# Patient Record
Sex: Female | Born: 1965
Health system: Southern US, Community
[De-identification: ages and names within clinical notes are randomized; demographics above are authoritative.]

## PROBLEM LIST (undated history)

## (undated) DIAGNOSIS — O24419 Gestational diabetes mellitus in pregnancy, unspecified control: Secondary | ICD-10-CM

## (undated) DIAGNOSIS — R5383 Other fatigue: Secondary | ICD-10-CM

## (undated) DIAGNOSIS — G5600 Carpal tunnel syndrome, unspecified upper limb: Secondary | ICD-10-CM

## (undated) DIAGNOSIS — E663 Overweight: Secondary | ICD-10-CM

## (undated) DIAGNOSIS — Z0000 Encounter for general adult medical examination without abnormal findings: Secondary | ICD-10-CM

## (undated) DIAGNOSIS — E785 Hyperlipidemia, unspecified: Secondary | ICD-10-CM

## (undated) DIAGNOSIS — R739 Hyperglycemia, unspecified: Secondary | ICD-10-CM

## (undated) DIAGNOSIS — R030 Elevated blood-pressure reading, without diagnosis of hypertension: Secondary | ICD-10-CM

## (undated) DIAGNOSIS — K219 Gastro-esophageal reflux disease without esophagitis: Secondary | ICD-10-CM

## (undated) DIAGNOSIS — IMO0001 Reserved for inherently not codable concepts without codable children: Secondary | ICD-10-CM

## (undated) DIAGNOSIS — B019 Varicella without complication: Secondary | ICD-10-CM

## (undated) HISTORY — DX: Varicella without complication: B01.9

## (undated) HISTORY — DX: Encounter for general adult medical examination without abnormal findings: Z00.00

## (undated) HISTORY — DX: Overweight: E66.3

## (undated) HISTORY — DX: Other fatigue: R53.83

## (undated) HISTORY — DX: Elevated blood-pressure reading, without diagnosis of hypertension: R03.0

## (undated) HISTORY — PX: APPENDECTOMY: SHX54

## (undated) HISTORY — DX: Hyperglycemia, unspecified: R73.9

## (undated) HISTORY — DX: Gastro-esophageal reflux disease without esophagitis: K21.9

## (undated) HISTORY — DX: Reserved for inherently not codable concepts without codable children: IMO0001

## (undated) HISTORY — DX: Hyperlipidemia, unspecified: E78.5

## (undated) HISTORY — DX: Carpal tunnel syndrome, unspecified upper limb: G56.00

## (undated) HISTORY — DX: Gestational diabetes mellitus in pregnancy, unspecified control: O24.419

---

## 1999-10-03 ENCOUNTER — Other Ambulatory Visit: Admission: RE | Admit: 1999-10-03 | Discharge: 1999-10-03 | Payer: Self-pay | Admitting: Obstetrics and Gynecology

## 2001-04-28 ENCOUNTER — Other Ambulatory Visit: Admission: RE | Admit: 2001-04-28 | Discharge: 2001-04-28 | Payer: Self-pay | Admitting: Obstetrics and Gynecology

## 2002-08-19 HISTORY — PX: ECTOPIC PREGNANCY SURGERY: SHX613

## 2003-03-01 ENCOUNTER — Observation Stay (HOSPITAL_COMMUNITY): Admission: AD | Admit: 2003-03-01 | Discharge: 2003-03-02 | Payer: Self-pay | Admitting: Obstetrics and Gynecology

## 2003-03-02 ENCOUNTER — Inpatient Hospital Stay (HOSPITAL_COMMUNITY): Admission: AD | Admit: 2003-03-02 | Discharge: 2003-03-03 | Payer: Self-pay | Admitting: Obstetrics & Gynecology

## 2003-12-19 ENCOUNTER — Other Ambulatory Visit: Admission: RE | Admit: 2003-12-19 | Discharge: 2003-12-19 | Payer: Self-pay | Admitting: Obstetrics and Gynecology

## 2008-08-19 LAB — HM MAMMOGRAPHY

## 2008-08-19 LAB — HM PAP SMEAR

## 2011-09-25 ENCOUNTER — Encounter: Payer: Self-pay | Admitting: Family Medicine

## 2011-09-25 ENCOUNTER — Ambulatory Visit (INDEPENDENT_AMBULATORY_CARE_PROVIDER_SITE_OTHER): Payer: PRIVATE HEALTH INSURANCE | Admitting: Family Medicine

## 2011-09-25 DIAGNOSIS — R5383 Other fatigue: Secondary | ICD-10-CM

## 2011-09-25 DIAGNOSIS — Z0000 Encounter for general adult medical examination without abnormal findings: Secondary | ICD-10-CM

## 2011-09-25 DIAGNOSIS — R030 Elevated blood-pressure reading, without diagnosis of hypertension: Secondary | ICD-10-CM

## 2011-09-25 DIAGNOSIS — Z1211 Encounter for screening for malignant neoplasm of colon: Secondary | ICD-10-CM | POA: Insufficient documentation

## 2011-09-25 DIAGNOSIS — O99810 Abnormal glucose complicating pregnancy: Secondary | ICD-10-CM

## 2011-09-25 DIAGNOSIS — R7309 Other abnormal glucose: Secondary | ICD-10-CM

## 2011-09-25 DIAGNOSIS — E663 Overweight: Secondary | ICD-10-CM

## 2011-09-25 DIAGNOSIS — E119 Type 2 diabetes mellitus without complications: Secondary | ICD-10-CM | POA: Insufficient documentation

## 2011-09-25 DIAGNOSIS — IMO0001 Reserved for inherently not codable concepts without codable children: Secondary | ICD-10-CM

## 2011-09-25 DIAGNOSIS — R5381 Other malaise: Secondary | ICD-10-CM

## 2011-09-25 DIAGNOSIS — O24419 Gestational diabetes mellitus in pregnancy, unspecified control: Secondary | ICD-10-CM

## 2011-09-25 DIAGNOSIS — E785 Hyperlipidemia, unspecified: Secondary | ICD-10-CM

## 2011-09-25 DIAGNOSIS — R739 Hyperglycemia, unspecified: Secondary | ICD-10-CM

## 2011-09-25 DIAGNOSIS — Z800 Family history of malignant neoplasm of digestive organs: Secondary | ICD-10-CM

## 2011-09-25 HISTORY — DX: Other fatigue: R53.83

## 2011-09-25 HISTORY — DX: Encounter for general adult medical examination without abnormal findings: Z00.00

## 2011-09-25 LAB — LIPID PANEL
HDL: 48.8 mg/dL (ref >39.00)
Total CHOL/HDL Ratio: 6
Triglycerides: 90 mg/dL (ref 0.0–149.0)
VLDL: 18 mg/dL (ref 0.0–40.0)

## 2011-09-25 LAB — RENAL FUNCTION PANEL
BUN: 15 mg/dL (ref 6–23)
CO2: 26 mEq/L (ref 19–32)
Chloride: 104 mEq/L (ref 96–112)
GFR: 80.28 mL/min (ref >60.00)
Phosphorus: 2.9 mg/dL (ref 2.3–4.6)
Potassium: 4.4 mEq/L (ref 3.5–5.1)

## 2011-09-25 LAB — HEMOGLOBIN A1C: Hgb A1c MFr Bld: 7.6 % — ABNORMAL HIGH (ref 4.6–6.5)

## 2011-09-25 LAB — CBC
Hemoglobin: 15.1 g/dL — ABNORMAL HIGH (ref 12.0–15.0)
Platelets: 267 10*3/uL (ref 150.0–400.0)
RDW: 14.3 % (ref 11.5–14.6)
WBC: 9 10*3/uL (ref 4.5–10.5)

## 2011-09-25 LAB — HEPATIC FUNCTION PANEL
AST: 21 U/L (ref 0–37)
Albumin: 4.6 g/dL (ref 3.5–5.2)
Alkaline Phosphatase: 75 U/L (ref 39–117)
Bilirubin, Direct: 0 mg/dL (ref 0.0–0.3)

## 2011-09-25 NOTE — Assessment & Plan Note (Signed)
Patient reports a fasting blood sugar of 127 a couple of years ago, repeated fasting renal today results pending. She is encouraged to minimize simple carbs

## 2011-09-25 NOTE — Assessment & Plan Note (Signed)
Avoid trans fats, minimize saturated fats and simple carbs and start a MegaRed cap daily

## 2011-09-25 NOTE — Assessment & Plan Note (Signed)
Patient given paperwork on the DASH diet and encouraged to attempt moderate increase in exercise, decrease in po intake and sodium and we will reassess at next visit.

## 2011-09-25 NOTE — Assessment & Plan Note (Signed)
Consider DASH diet, small and frequent meals, lean proteins and complex carbs

## 2011-09-25 NOTE — Assessment & Plan Note (Signed)
Denies any trouble since her pregnancy but will need to monitor

## 2011-09-25 NOTE — Assessment & Plan Note (Signed)
Multifactorial but does often get less than 6 hours of sleep each night. Encouraged to increase her sleep add exercise and eat a heart healthy diet.

## 2011-09-25 NOTE — Assessment & Plan Note (Addendum)
Will proceed with referral for colonoscopy and screening MGM and then she agrees to return next month for pap smear.

## 2011-09-25 NOTE — Patient Instructions (Signed)

## 2011-09-25 NOTE — Progress Notes (Signed)
Patient ID: Jennifer Odom, female   DOB: 1966/03/31, 46 y.o.   MRN: [id number] Jennifer Odom [id number] 07-13-1966 09/25/2011      Progress Note New Patient  Subjective  Chief Complaint  Chief Complaint  Patient presents with  . Establish Care    new patient/ labs for DM    HPI  Patient is a 46 year old Caucasian female who is in today for a new patient appointment. She is a single parent and only child is recently left for college. She works as a IT consultant. She reports overall she's doing well but is struggling with some persistent fatigue. Only gets about 5 or 6 hours of sleep a night. Notes in the past she had gestational diabetes but had been doing well until she had a blood sugar checked about 2 years ago. She reports fasting blood sugar 127. She denies polyuria or polydipsia. She denies any recent febrile illness, chest pain, palpitations, shortness of breath, GI or GU complaints otherwise. She has a family history significant for a mother who died at age 82 of colon cancer and has not proceed with colonoscopy thus far. She has not had a Pap smear or mammogram in several years either.  Past Medical History  Diagnosis Date  . Chicken pox as a child  . Mumps as a child  . Hyperglycemia   . Hyperlipidemia   . Elevated BP   . Overweight   . Preventative health care 09/25/2011  . Fatigue 09/25/2011  . Diabetes mellitus 1994    gestational   . GDM (gestational diabetes mellitus)     Past Surgical History  Procedure Date  . Appendectomy     high school  . Ectopic pregnancy surgery 2004    left side?    Family History  Problem Relation Age of Onset  . Cancer Mother     colon  . Diabetes Father     type 2  . Heart disease Father     open heart surgery,triple bypass  . Leukemia Father   . Cancer Father     CLL  . Stroke Maternal Grandmother     X several  . Hypertension Maternal Grandmother   . Emphysema Maternal Grandfather     smoker  . Heart attack Paternal  Grandfather   . Heart disease Paternal Grandfather     MI, sudden death  . Endometriosis Sister     History   Social History  . Marital Status: Divorced    Spouse Name: N/A    Number of Children: N/A  . Years of Education: N/A   Occupational History  . Not on file.   Social History Main Topics  . Smoking status: Never Smoker   . Smokeless tobacco: Never Used  . Alcohol Use: Yes     maybe once a month  . Drug Use: No  . Sexually Active: No   Other Topics Concern  . Not on file   Social History Narrative  . No narrative on file    No current outpatient prescriptions on file prior to visit.    Allergies  Allergen Reactions  . Sulfa Antibiotics Hives    Review of Systems  Review of Systems  Constitutional: Positive for malaise/fatigue. Negative for fever and chills.  HENT: Negative for hearing loss, nosebleeds and congestion.   Eyes: Negative for discharge.  Respiratory: Negative for cough, sputum production, shortness of breath and wheezing.   Cardiovascular: Negative for chest pain, palpitations and leg swelling.  Gastrointestinal: Negative for heartburn, nausea,  vomiting, abdominal pain, diarrhea, constipation and blood in stool.  Genitourinary: Negative for dysuria, urgency, frequency and hematuria.  Musculoskeletal: Negative for myalgias, back pain and falls.  Skin: Negative for rash.  Neurological: Negative for dizziness, tremors, sensory change, focal weakness, loss of consciousness, weakness and headaches.  Endo/Heme/Allergies: Negative for polydipsia. Does not bruise/bleed easily.  Psychiatric/Behavioral: Negative for depression and suicidal ideas. The patient is not nervous/anxious and does not have insomnia.     Objective  BP 138/94  Pulse 89  Temp(Src) 98.9 F (37.2 C) (Temporal)  Ht 5' 5.75" (1.67 m)  Wt 190 lb 6.4 oz (86.365 kg)  BMI 30.97 kg/m2  SpO2 96%  LMP 09/11/2011  Physical Exam  Physical Exam  Constitutional: She is oriented to  person, place, and time and well-developed, well-nourished, and in no distress. No distress.  HENT:  Head: Normocephalic and atraumatic.  Right Ear: External ear normal.  Left Ear: External ear normal.  Nose: Nose normal.  Mouth/Throat: Oropharynx is clear and moist. No oropharyngeal exudate.  Eyes: Conjunctivae are normal. Pupils are equal, round, and reactive to light. Right eye exhibits no discharge. Left eye exhibits no discharge. No scleral icterus.  Neck: Normal range of motion. Neck supple. No thyromegaly present.  Cardiovascular: Normal rate, regular rhythm, normal heart sounds and intact distal pulses.   No murmur heard. Pulmonary/Chest: Effort normal and breath sounds normal. No respiratory distress. She has no wheezes. She has no rales.  Abdominal: Soft. Bowel sounds are normal. She exhibits no distension and no mass. There is no tenderness.  Musculoskeletal: Normal range of motion. She exhibits no edema and no tenderness.  Lymphadenopathy:    She has no cervical adenopathy.  Neurological: She is alert and oriented to person, place, and time. She has normal reflexes. No cranial nerve deficit. Coordination normal.  Skin: Skin is warm and dry. No rash noted. She is not diaphoretic.  Psychiatric: Mood, memory and affect normal.       Assessment & Plan  Preventative health care Will proceed with referral for colonoscopy and screening MGM and then she agrees to return next month for pap smear.  Elevated BP Patient given paperwork on the DASH diet and encouraged to attempt moderate increase in exercise, decrease in po intake and sodium and we will reassess at next visit.  Hyperglycemia Patient reports a fasting blood sugar of 127 a couple of years ago, repeated fasting renal today results pending. She is encouraged to minimize simple carbs  GDM (gestational diabetes mellitus) Denies any trouble since her pregnancy but will need to monitor  Fatigue Multifactorial but does  often get less than 6 hours of sleep each night. Encouraged to increase her sleep add exercise and eat a heart healthy diet.  Overweight Consider DASH diet, small and frequent meals, lean proteins and complex carbs  Hyperlipidemia Avoid trans fats, minimize saturated fats and simple carbs and start a MegaRed cap daily

## 2011-09-26 ENCOUNTER — Encounter: Payer: Self-pay | Admitting: Family Medicine

## 2011-09-26 NOTE — Telephone Encounter (Signed)
FYI

## 2011-10-01 ENCOUNTER — Ambulatory Visit (INDEPENDENT_AMBULATORY_CARE_PROVIDER_SITE_OTHER): Payer: PRIVATE HEALTH INSURANCE | Admitting: Family Medicine

## 2011-10-01 ENCOUNTER — Encounter: Payer: Self-pay | Admitting: Family Medicine

## 2011-10-01 VITALS — BP 136/100 | HR 94 | Temp 99.2°F | Ht 65.75 in | Wt 189.0 lb

## 2011-10-01 DIAGNOSIS — E785 Hyperlipidemia, unspecified: Secondary | ICD-10-CM

## 2011-10-01 DIAGNOSIS — R030 Elevated blood-pressure reading, without diagnosis of hypertension: Secondary | ICD-10-CM

## 2011-10-01 DIAGNOSIS — IMO0001 Reserved for inherently not codable concepts without codable children: Secondary | ICD-10-CM

## 2011-10-01 DIAGNOSIS — R7301 Impaired fasting glucose: Secondary | ICD-10-CM

## 2011-10-01 DIAGNOSIS — E119 Type 2 diabetes mellitus without complications: Secondary | ICD-10-CM

## 2011-10-01 LAB — GLUCOSE, POCT (MANUAL RESULT ENTRY): POC Glucose: 110

## 2011-10-01 MED ORDER — PSYLLIUM 58.6 % PO POWD: 1.0000 | Freq: Every day | ORAL | Status: AC

## 2011-10-01 MED ORDER — KRILL OIL PO CAPS: 1.0000 | ORAL_CAPSULE | Freq: Every day | ORAL | Status: AC

## 2011-10-01 MED ORDER — NIACIN ER 500 MG PO CPCR: ORAL_CAPSULE | ORAL | Status: DC

## 2011-10-01 NOTE — Assessment & Plan Note (Signed)
Strongly encouraged to consider starting a statin, would like to try diet and exercise for 3 months. She will start MegaRed, Metamucil and Niacin, avoid trans fats

## 2011-10-01 NOTE — Assessment & Plan Note (Signed)
Patient very anxious today, minimize sodium recheck at next visit.

## 2011-10-01 NOTE — Assessment & Plan Note (Signed)
Patient given glucometer and training today, encouraged to check blood sugars qam and as needed, keep a log. Patient has made a commitment to change her diet and has eaten no sweets and consumed minimal carbs since her she was given the news of her DM last. Week. She has increased her exercise, she is given diet and educational materials today. We will recheck hgba1c in May of 2013 and consider meds at that time if hgba1c remains above 7.

## 2011-10-01 NOTE — Patient Instructions (Signed)
Diabetes and Exercise Regular exercise is important and can help:   Control blood glucose (sugar).   Decrease blood pressure.    Control blood lipids (cholesterol, triglycerides).   Improve overall health.  BENEFITS FROM EXERCISE  Improved fitness.   Improved flexibility.   Improved endurance.   Increased bone density.   Weight control.   Increased muscle strength.   Decreased body fat.   Improvement of the body's use of insulin, a hormone.   Increased insulin sensitivity.   Reduction of insulin needs.   Reduced stress and tension.   Helps you feel better.  People with diabetes who add exercise to their lifestyle gain additional benefits, including:  Weight loss.   Reduced appetite.   Improvement of the body's use of blood glucose.   Decreased risk factors for heart disease:   Lowering of cholesterol and triglycerides.   Raising the level of good cholesterol (high-density lipoproteins, HDL).   Lowering blood sugar.   Decreased blood pressure.  TYPE 1 DIABETES AND EXERCISE  Exercise will usually lower your blood glucose.   If blood glucose is greater than 240 mg/dl, check urine ketones. If ketones are present, do not exercise.   Location of the insulin injection sites may need to be adjusted with exercise. Avoid injecting insulin into areas of the body that will be exercised. For example, avoid injecting insulin into:   The arms when playing tennis.   The legs when jogging. For more information, discuss this with your caregiver.   Keep a record of:   Food intake.   Type and amount of exercise.   Expected peak times of insulin action.   Blood glucose levels.  Do this before, during, and after exercise. Review your records with your caregiver. This will help you to develop guidelines for adjusting food intake and insulin amounts.  TYPE 2 DIABETES AND EXERCISE  Regular physical activity can help control blood glucose.   Exercise is important  because it may:   Increase the body's sensitivity to insulin.   Improve blood glucose control.   Exercise reduces the risk of heart disease. It decreases serum cholesterol and triglycerides. It also lowers blood pressure.   Those who take insulin or oral hypoglycemic agents should watch for signs of hypoglycemia. These signs include dizziness, shaking, sweating, chills, and confusion.   Body water is lost during exercise. It must be replaced. This will help to avoid loss of body fluids (dehydration) or heat stroke.  Be sure to talk to your caregiver before starting an exercise program to make sure it is safe for you. Remember, any activity is better than none.  Document Released: 10/26/2003 Document Revised: 04/17/2011 Document Reviewed: 02/09/2009 [facility] Patient Information 2012 [place], [place].  Fasting sugars between 80  And 120 After eating in 1-2 hours can tolerate sugars 100 to 150 MegaRed caps daily Metamucil daily Avoid trans fats/partially hydrogenated oils

## 2011-10-01 NOTE — Progress Notes (Signed)
Patient ID: Jennifer Odom, female   DOB: 1966-04-21, 46 y.o.   MRN: [id number] Jennifer Odom [id number] 16-Jan-1966 10/01/2011      Progress Note-Follow Up  Subjective  Chief Complaint  Chief Complaint  Patient presents with  . Follow-up    HPI  Patient is a 46 year old Caucasian female who is in today to discuss her new onset diabetes. Her hemoglobin A1c was 7.6. Since we told her last week she cut out sugar, diet sodas and most herbs. She says she feels better and is tired she's been exercising. She has changed her diet and is committed to trying to bring her sugars and cholesterol down. Denies headaches, chest pain, palpitations or recent illness.  Past Medical History  Diagnosis Date  . Chicken pox as a child  . Mumps as a child  . Hyperglycemia   . Hyperlipidemia   . Elevated BP   . Overweight   . Preventative health care 09/25/2011  . Fatigue 09/25/2011  . Diabetes mellitus 1994    gestational   . GDM (gestational diabetes mellitus)   . Diabetes mellitus     Past Surgical History  Procedure Date  . Appendectomy     high school  . Ectopic pregnancy surgery 2004    left side?    Family History  Problem Relation Age of Onset  . Cancer Mother     colon  . Diabetes Father     type 2  . Heart disease Father     open heart surgery,triple bypass  . Leukemia Father   . Cancer Father     CLL  . Stroke Maternal Grandmother     X several  . Hypertension Maternal Grandmother   . Emphysema Maternal Grandfather     smoker  . Heart attack Paternal Grandfather   . Heart disease Paternal Grandfather     MI, sudden death  . Endometriosis Sister     History   Social History  . Marital Status: Divorced    Spouse Name: N/A    Number of Children: N/A  . Years of Education: N/A   Occupational History  . Not on file.   Social History Main Topics  . Smoking status: Never Smoker   . Smokeless tobacco: Never Used  . Alcohol Use: Yes     maybe once a month  .  Drug Use: No  . Sexually Active: No   Other Topics Concern  . Not on file   Social History Narrative  . No narrative on file    Current Outpatient Prescriptions on File Prior to Visit  Medication Sig Dispense Refill  . fish oil-omega-3 fatty acids 1000 MG capsule Take 2 g by mouth daily.      . Multiple Vitamin (MULTIVITAMIN) tablet Take 1 tablet by mouth daily.        Allergies  Allergen Reactions  . Sulfa Antibiotics Hives    Review of Systems  Review of Systems  Constitutional: Positive for malaise/fatigue. Negative for fever.  HENT: Negative for congestion.   Eyes: Negative for discharge.  Respiratory: Negative for shortness of breath.   Cardiovascular: Negative for chest pain, palpitations and leg swelling.  Gastrointestinal: Negative for nausea, abdominal pain and diarrhea.  Genitourinary: Negative for dysuria.  Musculoskeletal: Negative for falls.  Skin: Negative for rash.  Neurological: Negative for loss of consciousness and headaches.  Endo/Heme/Allergies: Negative for polydipsia.  Psychiatric/Behavioral: Negative for depression and suicidal ideas. The patient is not nervous/anxious and does not have insomnia.  Objective  BP 136/100  Pulse 94  Temp(Src) 99.2 F (37.3 C) (Temporal)  Ht 5' 5.75" (1.67 m)  Wt 189 lb (85.73 kg)  BMI 30.74 kg/m2  SpO2 97%  LMP 09/11/2011  Physical Exam  Physical Exam  Constitutional: She is oriented to person, place, and time and well-developed, well-nourished, and in no distress. No distress.  HENT:  Head: Normocephalic and atraumatic.  Eyes: Conjunctivae are normal.  Neck: Neck supple. No thyromegaly present.  Cardiovascular: Normal rate, regular rhythm and normal heart sounds.   No murmur heard. Pulmonary/Chest: Effort normal and breath sounds normal. She has no wheezes.  Abdominal: She exhibits no distension and no mass.  Musculoskeletal: She exhibits no edema.  Lymphadenopathy:    She has no cervical  adenopathy.  Neurological: She is alert and oriented to person, place, and time.  Skin: Skin is warm and dry. No rash noted. She is not diaphoretic.  Psychiatric: Memory, affect and judgment normal.    Lab Results  Component Value Date   TSH 2.00 09/25/2011   Lab Results  Component Value Date   WBC 9.0 09/25/2011   HGB 15.1* 09/25/2011   HCT 45.0 09/25/2011   MCV 87.8 09/25/2011   PLT 267.0 09/25/2011   Lab Results  Component Value Date   CREATININE 0.8 09/25/2011   BUN 15 09/25/2011   NA 138 09/25/2011   K 4.4 09/25/2011   CL 104 09/25/2011   CO2 26 09/25/2011   Lab Results  Component Value Date   ALT 27 09/25/2011   AST 21 09/25/2011   ALKPHOS 75 09/25/2011   BILITOT 0.5 09/25/2011   Lab Results  Component Value Date   CHOL 269* 09/25/2011   Lab Results  Component Value Date   HDL 48.80 09/25/2011   No results found for this basename: LDLCALC   Lab Results  Component Value Date   TRIG 90.0 09/25/2011   Lab Results  Component Value Date   CHOLHDL 6 09/25/2011     Assessment & Plan  Diabetes mellitus Patient given glucometer and training today, encouraged to check blood sugars qam and as needed, keep a log. Patient has made a commitment to change her diet and has eaten no sweets and consumed minimal carbs since her she was given the news of her DM last. Week. She has increased her exercise, she is given diet and educational materials today. We will recheck hgba1c in May of 2013 and consider meds at that time if hgba1c remains above 7.   Elevated BP Patient very anxious today, minimize sodium recheck at next visit.  Hyperlipidemia Strongly encouraged to consider starting a statin, would like to try diet and exercise for 3 months. She will start MegaRed, Metamucil and Niacin, avoid trans fats

## 2011-10-02 ENCOUNTER — Encounter: Payer: Self-pay | Admitting: Gastroenterology

## 2011-10-02 MED ORDER — GLUCOSE BLOOD VI STRP: 1.0000 | ORAL_STRIP | Status: DC | PRN

## 2011-10-02 NOTE — Progress Notes (Signed)
Addended by: Court Joy on: 10/02/2011 01:59 PM   Modules accepted: Orders

## 2011-10-23 ENCOUNTER — Ambulatory Visit: Payer: PRIVATE HEALTH INSURANCE | Admitting: Family Medicine

## 2011-10-23 DIAGNOSIS — Z0289 Encounter for other administrative examinations: Secondary | ICD-10-CM

## 2011-10-30 ENCOUNTER — Other Ambulatory Visit: Payer: PRIVATE HEALTH INSURANCE | Admitting: Gastroenterology

## 2012-01-22 ENCOUNTER — Encounter: Payer: Self-pay | Admitting: Family Medicine

## 2012-01-23 ENCOUNTER — Encounter: Payer: Self-pay | Admitting: Family Medicine

## 2012-01-23 ENCOUNTER — Ambulatory Visit (INDEPENDENT_AMBULATORY_CARE_PROVIDER_SITE_OTHER): Payer: PRIVATE HEALTH INSURANCE | Admitting: Family Medicine

## 2012-01-23 VITALS — BP 130/85 | HR 77 | Temp 98.1°F | Ht 65.75 in | Wt 185.0 lb

## 2012-01-23 DIAGNOSIS — E119 Type 2 diabetes mellitus without complications: Secondary | ICD-10-CM

## 2012-01-23 DIAGNOSIS — E785 Hyperlipidemia, unspecified: Secondary | ICD-10-CM

## 2012-01-23 DIAGNOSIS — E663 Overweight: Secondary | ICD-10-CM

## 2012-01-23 DIAGNOSIS — R5383 Other fatigue: Secondary | ICD-10-CM

## 2012-01-23 DIAGNOSIS — R030 Elevated blood-pressure reading, without diagnosis of hypertension: Secondary | ICD-10-CM

## 2012-01-23 DIAGNOSIS — IMO0001 Reserved for inherently not codable concepts without codable children: Secondary | ICD-10-CM

## 2012-01-23 DIAGNOSIS — G5600 Carpal tunnel syndrome, unspecified upper limb: Secondary | ICD-10-CM

## 2012-01-23 DIAGNOSIS — R5381 Other malaise: Secondary | ICD-10-CM

## 2012-01-23 HISTORY — DX: Carpal tunnel syndrome, unspecified upper limb: G56.00

## 2012-01-23 LAB — HEPATIC FUNCTION PANEL
ALT: 23 U/L (ref 0–35)
AST: 17 U/L (ref 0–37)
Bilirubin, Direct: 0 mg/dL (ref 0.0–0.3)
Total Protein: 7.3 g/dL (ref 6.0–8.3)

## 2012-01-23 LAB — RENAL FUNCTION PANEL
BUN: 14 mg/dL (ref 6–23)
CO2: 27 mEq/L (ref 19–32)
Calcium: 9.2 mg/dL (ref 8.4–10.5)
Chloride: 107 mEq/L (ref 96–112)
GFR: 109.13 mL/min (ref >60.00)

## 2012-01-23 LAB — CBC
MCHC: 32.7 g/dL (ref 30.0–36.0)
MCV: 87.1 fl (ref 78.0–100.0)
RBC: 5.02 Mil/uL (ref 3.87–5.11)
RDW: 13.8 % (ref 11.5–14.6)

## 2012-01-23 LAB — LIPID PANEL: HDL: 47.2 mg/dL (ref >39.00)

## 2012-01-23 LAB — HEMOGLOBIN A1C: Hgb A1c MFr Bld: 7.3 % — ABNORMAL HIGH (ref 4.6–6.5)

## 2012-01-23 NOTE — Patient Instructions (Signed)

## 2012-01-23 NOTE — Assessment & Plan Note (Signed)
Good weight loss since last visit with dietary changes, has yet to incorporate exercise so she is encouraged to try to add even a 15 minute walk daily and continue avoid simple carbs and trans fats.

## 2012-01-23 NOTE — Assessment & Plan Note (Signed)
Patient will have repeat lipid panel run today. She will continue MegaRed and Niacin. She has some Red Yeast Rice and has decided to start this bid. She is encouraged to start some CoQ10 as well and we will continue to monitor

## 2012-01-23 NOTE — Assessment & Plan Note (Signed)
Improving with dietary changes

## 2012-01-23 NOTE — Assessment & Plan Note (Signed)
Has made good dietary changes and had weight loss has cut out most carbs. Did have one low bs of 48 but generally hr numbers are in the normal range. Check a hgba1c today

## 2012-01-23 NOTE — Assessment & Plan Note (Signed)
Vs thoracic outlet syndrome, does have symptoms c/w both. Encouraged moist heat and gentle stretching at neck and ice and Aspercreme at wrists. Consider Physiatry if no improvement. Does work long hours at [employer] and on a computer

## 2012-01-23 NOTE — Progress Notes (Signed)
Patient ID: Jennifer Odom, female   DOB: [birth year], 46 y.o.   MRN: [id number] Jennifer Odom [id number] [birth year] 01/23/2012      Progress Note-Follow Up  Subjective  Chief Complaint  Chief Complaint  Patient presents with  . Follow-up    A1C checked also    HPI  Patient is a 46 year old Caucasian female who is in today for followup of on diabetes and multiple medical problems. Overall she's made many good changes. She's cut out simple carbs. She is avoiding transplant. She's added Krill oil and niacin. She reports she feels better she's lost weight. Her energy level somewhat better. In general her blood sugars are within normal ranges although she did have one number at 40 and she felt anxious and shaky. Her only new complaint today is of paresthesias in her fingers. Denies any in her feet. Happens in both hands and encompasses the whole hand. He does. Yesterday was present for most of the day but more often occurs in the evenings. Mornings are not bad and she doesn't wake up with the sensation. She has some stiffness in her neck and shoulders as well. Denies any falls or weakness.  Past Medical History  Diagnosis Date  . Chicken pox as a child  . Mumps as a child  . Hyperglycemia   . Hyperlipidemia   . Elevated BP   . Overweight   . Preventative health care 09/25/2011  . Fatigue 09/25/2011  . Diabetes mellitus 1994    gestational   . GDM (gestational diabetes mellitus)   . Diabetes mellitus     Past Surgical History  Procedure Date  . Appendectomy     high school  . Ectopic pregnancy surgery 2004    left side?    Family History  Problem Relation Age of Onset  . Cancer Mother     colon  . Diabetes Father     type 2  . Heart disease Father     open heart surgery,triple bypass  . Leukemia Father   . Cancer Father     CLL  . Stroke Maternal Grandmother     X several  . Hypertension Maternal Grandmother   . Emphysema Maternal Grandfather     smoker  . Heart  attack Paternal Grandfather   . Heart disease Paternal Grandfather     MI, sudden death  . Endometriosis Sister     History   Social History  . Marital Status: Divorced    Spouse Name: N/A    Number of Children: N/A  . Years of Education: N/A   Occupational History  . Not on file.   Social History Main Topics  . Smoking status: Never Smoker   . Smokeless tobacco: Never Used  . Alcohol Use: Yes     maybe once a month  . Drug Use: No  . Sexually Active: No   Other Topics Concern  . Not on file   Social History Narrative  . No narrative on file    Current Outpatient Prescriptions on File Prior to Visit  Medication Sig Dispense Refill  . fish oil-omega-3 fatty acids 1000 MG capsule Take 2 g by mouth daily.      Marland Kitchen glucose blood (FREESTYLE LITE) test strip 1 each by Other route as needed for other.  100 each  4  . Krill Oil CAPS Take 1 capsule by mouth daily.  30 capsule  0  . Multiple Vitamin (MULTIVITAMIN) tablet Take 1 tablet by mouth daily.      Marland Kitchen  niacin 500 MG CR capsule 1 cap po qhs x 1 week and if tolerated increase to 2 tabs po at bedtime. Take a lowfat snack and an 81 mg aspirin 1/2 hour prior  30 capsule  0  . psyllium (METAMUCIL SMOOTH TEXTURE) 58.6 % powder Take 1 packet by mouth daily. In 8 oz of fluids  283 g  12    Allergies  Allergen Reactions  . Sulfa Antibiotics Hives    Review of Systems  Review of Systems  Constitutional: Negative for fever and malaise/fatigue.  HENT: Negative for congestion.   Eyes: Negative for discharge.  Respiratory: Negative for shortness of breath.   Cardiovascular: Negative for chest pain, palpitations and leg swelling.  Gastrointestinal: Negative for nausea, abdominal pain and diarrhea.  Genitourinary: Negative for dysuria.  Musculoskeletal: Negative for falls.  Skin: Negative for rash.  Neurological: Positive for tingling. Negative for loss of consciousness and headaches.       Hands  Endo/Heme/Allergies: Negative  for polydipsia.  Psychiatric/Behavioral: Negative for depression and suicidal ideas. The patient is not nervous/anxious and does not have insomnia.     Objective  BP 130/85  Pulse 77  Temp(Src) 98.1 F (36.7 C) (Temporal)  Ht 5' 5.75" (1.67 m)  Wt 185 lb (83.915 kg)  BMI 30.09 kg/m2  SpO2 99%  LMP 12/16/2011  Physical Exam  Physical Exam  Constitutional: She is oriented to person, place, and time and well-developed, well-nourished, and in no distress. No distress.  HENT:  Head: Normocephalic and atraumatic.  Eyes: Conjunctivae are normal.  Neck: Neck supple. No thyromegaly present.  Cardiovascular: Normal rate, regular rhythm and normal heart sounds.   No murmur heard. Pulmonary/Chest: Effort normal and breath sounds normal. She has no wheezes.  Abdominal: She exhibits no distension and no mass.  Musculoskeletal: She exhibits no edema.  Lymphadenopathy:    She has no cervical adenopathy.  Neurological: She is alert and oriented to person, place, and time.  Skin: Skin is warm and dry. No rash noted. She is not diaphoretic.  Psychiatric: Memory, affect and judgment normal.    Lab Results  Component Value Date   TSH 2.00 09/25/2011   Lab Results  Component Value Date   WBC 9.0 09/25/2011   HGB 15.1* 09/25/2011   HCT 45.0 09/25/2011   MCV 87.8 09/25/2011   PLT 267.0 09/25/2011   Lab Results  Component Value Date   CREATININE 0.8 09/25/2011   BUN 15 09/25/2011   NA 138 09/25/2011   K 4.4 09/25/2011   CL 104 09/25/2011   CO2 26 09/25/2011   Lab Results  Component Value Date   ALT 27 09/25/2011   AST 21 09/25/2011   ALKPHOS 75 09/25/2011   BILITOT 0.5 09/25/2011   Lab Results  Component Value Date   CHOL 269* 09/25/2011   Lab Results  Component Value Date   HDL 48.80 09/25/2011   No results found for this basename: LDLCALC   Lab Results  Component Value Date   TRIG 90.0 09/25/2011   Lab Results  Component Value Date   CHOLHDL 6 09/25/2011     Assessment & Plan  Overweight Good  weight loss since last visit with dietary changes, has yet to incorporate exercise so she is encouraged to try to add even a 15 minute walk daily and continue avoid simple carbs and trans fats.   Elevated BP Improved to within normal limits with moderate weight loss, will continue to monitor  Hyperlipidemia Patient will have repeat  lipid panel run today. She will continue MegaRed and Niacin. She has some Red Yeast Rice and has decided to start this bid. She is encouraged to start some CoQ10 as well and we will continue to monitor  Diabetes mellitus Has made good dietary changes and had weight loss has cut out most carbs. Did have one low bs of 48 but generally hr numbers are in the normal range. Check a hgba1c today  Fatigue Improving with dietary changes.  CTS (carpal tunnel syndrome) Vs thoracic outlet syndrome, does have symptoms c/w both. Encouraged moist heat and gentle stretching at neck and ice and Aspercreme at wrists. Consider Physiatry if no improvement. Does work long hours at [employer] and on a computer

## 2012-01-23 NOTE — Assessment & Plan Note (Signed)
Improved to within normal limits with moderate weight loss, will continue to monitor

## 2012-01-24 ENCOUNTER — Encounter: Payer: Self-pay | Admitting: Family Medicine

## 2012-01-24 LAB — LDL CHOLESTEROL, DIRECT: Direct LDL: 163.9 mg/dL

## 2012-10-03 ENCOUNTER — Other Ambulatory Visit: Payer: Self-pay

## 2013-06-24 ENCOUNTER — Other Ambulatory Visit: Payer: Self-pay

## 2014-05-16 ENCOUNTER — Encounter: Payer: Self-pay | Admitting: Family Medicine

## 2014-05-16 ENCOUNTER — Ambulatory Visit (INDEPENDENT_AMBULATORY_CARE_PROVIDER_SITE_OTHER): Payer: BC Managed Care – PPO | Admitting: Family Medicine

## 2014-05-16 VITALS — BP 112/66 | HR 98 | Temp 98.3°F | Ht 65.75 in | Wt 193.8 lb

## 2014-05-16 DIAGNOSIS — Z1211 Encounter for screening for malignant neoplasm of colon: Secondary | ICD-10-CM | POA: Diagnosis not present

## 2014-05-16 DIAGNOSIS — E785 Hyperlipidemia, unspecified: Secondary | ICD-10-CM

## 2014-05-16 DIAGNOSIS — E119 Type 2 diabetes mellitus without complications: Secondary | ICD-10-CM

## 2014-05-16 DIAGNOSIS — IMO0001 Reserved for inherently not codable concepts without codable children: Secondary | ICD-10-CM

## 2014-05-16 DIAGNOSIS — R030 Elevated blood-pressure reading, without diagnosis of hypertension: Secondary | ICD-10-CM | POA: Diagnosis not present

## 2014-05-16 DIAGNOSIS — K219 Gastro-esophageal reflux disease without esophagitis: Secondary | ICD-10-CM

## 2014-05-16 DIAGNOSIS — E663 Overweight: Secondary | ICD-10-CM

## 2014-05-16 LAB — RENAL FUNCTION PANEL
Albumin: 4.2 g/dL (ref 3.5–5.2)
BUN: 10 mg/dL (ref 6–23)
CALCIUM: 9.3 mg/dL (ref 8.4–10.5)
CHLORIDE: 100 meq/L (ref 96–112)
CO2: 28 meq/L (ref 19–32)
CREATININE: 0.9 mg/dL (ref 0.4–1.2)
GFR: 74.1 mL/min (ref >60.00)
Glucose, Bld: 234 mg/dL — ABNORMAL HIGH (ref 70–99)
POTASSIUM: 4 meq/L (ref 3.5–5.1)
Phosphorus: 3.1 mg/dL (ref 2.3–4.6)
SODIUM: 133 meq/L — AB (ref 135–145)

## 2014-05-16 LAB — HEPATIC FUNCTION PANEL
ALT: 36 U/L — ABNORMAL HIGH (ref 0–35)
AST: 26 U/L (ref 0–37)
Albumin: 4.2 g/dL (ref 3.5–5.2)
Alkaline Phosphatase: 94 U/L (ref 39–117)
BILIRUBIN TOTAL: 0.7 mg/dL (ref 0.2–1.2)
Bilirubin, Direct: 0 mg/dL (ref 0.0–0.3)
Total Protein: 7.4 g/dL (ref 6.0–8.3)

## 2014-05-16 LAB — CBC
HCT: 45.3 % (ref 36.0–46.0)
HEMOGLOBIN: 15.2 g/dL — AB (ref 12.0–15.0)
MCHC: 33.6 g/dL (ref 30.0–36.0)
MCV: 85.5 fl (ref 78.0–100.0)
PLATELETS: 238 10*3/uL (ref 150.0–400.0)
RBC: 5.3 Mil/uL — AB (ref 3.87–5.11)
RDW: 13.8 % (ref 11.5–15.5)
WBC: 9.2 10*3/uL (ref 4.0–10.5)

## 2014-05-16 LAB — LIPID PANEL
CHOL/HDL RATIO: 5
Cholesterol: 231 mg/dL — ABNORMAL HIGH (ref 0–200)
HDL: 45.4 mg/dL (ref >39.00)
LDL Cholesterol: 159 mg/dL — ABNORMAL HIGH (ref 0–99)
NONHDL: 185.6
Triglycerides: 134 mg/dL (ref 0.0–149.0)
VLDL: 26.8 mg/dL (ref 0.0–40.0)

## 2014-05-16 LAB — HEMOGLOBIN A1C: HEMOGLOBIN A1C: 10.2 % — AB (ref 4.6–6.5)

## 2014-05-16 LAB — TSH: TSH: 1.66 u[IU]/mL (ref 0.35–4.50)

## 2014-05-16 NOTE — Progress Notes (Signed)
Patient ID: Jennifer Odom, female   DOB: 1966/07/26, 48 y.o.   MRN: [id number] Jennifer Odom [id number] Jan 13, 1966 05/16/2014      Progress Note-Follow Up  Subjective  Chief Complaint  Chief Complaint  Patient presents with  . Follow-up    on BS and med recheck    HPI  Patient is a 48year old female in today for routine medical care. Just graduated from [employer] at [employer] and is working for the [employer]. She is about to see her son graduated college but acknowledges over the last year she's been very stressed and not taking care of herself. She's not been eating well or exercising. She denies any recent illness her to come complaints despite this fact. Denies CP/palp/SOB/HA/congestion/fevers/GI or GU c/o. Taking meds as prescribed  Past Medical History  Diagnosis Date  . Chicken pox as a child  . Mumps as a child  . Hyperglycemia   . Hyperlipidemia   . Elevated BP   . Overweight(278.02)   . Preventative health care 09/25/2011  . Fatigue 09/25/2011  . Diabetes mellitus 1994    gestational   . GDM (gestational diabetes mellitus)   . Diabetes mellitus   . CTS (carpal tunnel syndrome) 01/23/2012    Past Surgical History  Procedure Laterality Date  . Appendectomy      high school  . Ectopic pregnancy surgery  2004    left side?    Family History  Problem Relation Age of Onset  . Cancer Mother     colon  . Diabetes Father     type 2  . Heart disease Father     open heart surgery,triple bypass  . Leukemia Father   . Cancer Father     CLL  . Stroke Maternal Grandmother     X several  . Hypertension Maternal Grandmother   . Emphysema Maternal Grandfather     smoker  . Heart attack Paternal Grandfather   . Heart disease Paternal Grandfather     MI, sudden death  . Endometriosis Sister     History   Social History  . Marital Status: Divorced    Spouse Name: N/A    Number of Children: N/A  . Years of Education: N/A   Occupational History  . Not on file.    Social History Main Topics  . Smoking status: Never Smoker   . Smokeless tobacco: Never Used  . Alcohol Use: Yes     Comment: maybe once a month  . Drug Use: No  . Sexual Activity: No   Other Topics Concern  . Not on file   Social History Narrative  . No narrative on file    Current Outpatient Prescriptions on File Prior to Visit  Medication Sig Dispense Refill  . fish oil-omega-3 fatty acids 1000 MG capsule Take 2 g by mouth daily.      Marland Kitchen glucose blood (FREESTYLE LITE) test strip 1 each by Other route as needed for other.  100 each  4  . Krill Oil CAPS Take 1 capsule by mouth daily.  30 capsule  0  . Multiple Vitamin (MULTIVITAMIN) tablet Take 1 tablet by mouth daily.      . niacin 500 MG CR capsule 1 cap po qhs x 1 week and if tolerated increase to 2 tabs po at bedtime. Take a lowfat snack and an 81 mg aspirin 1/2 hour prior  30 capsule  0   No current facility-administered medications on file prior to visit.  Allergies  Allergen Reactions  . Sulfa Antibiotics Hives    Review of Systems  Review of Systems  Constitutional: Negative for fever and malaise/fatigue.  HENT: Negative for congestion.   Eyes: Negative for discharge.  Respiratory: Negative for shortness of breath.   Cardiovascular: Negative for chest pain, palpitations and leg swelling.  Gastrointestinal: Negative for nausea, abdominal pain and diarrhea.  Genitourinary: Negative for dysuria.  Musculoskeletal: Negative for falls.  Skin: Negative for rash.  Neurological: Negative for loss of consciousness and headaches.  Endo/Heme/Allergies: Negative for polydipsia.  Psychiatric/Behavioral: Negative for depression and suicidal ideas. The patient is not nervous/anxious and does not have insomnia.     Objective  BP 112/66  Pulse 98  Temp(Src) 98.3 F (36.8 C) (Oral)  Ht 5' 5.75" (1.67 m)  Wt 193 lb 12.8 oz (87.907 kg)  BMI 31.52 kg/m2  SpO2 98%  LMP 07/19/2012  Physical Exam  Physical Exam   Constitutional: She is oriented to person, place, and time and well-developed, well-nourished, and in no distress. No distress.  HENT:  Head: Normocephalic and atraumatic.  Eyes: Conjunctivae are normal.  Neck: Neck supple. No thyromegaly present.  Cardiovascular: Normal rate, regular rhythm and normal heart sounds.   No murmur heard. Pulmonary/Chest: Effort normal and breath sounds normal. She has no wheezes.  Abdominal: She exhibits no distension and no mass.  Musculoskeletal: She exhibits no edema.  Lymphadenopathy:    She has no cervical adenopathy.  Neurological: She is alert and oriented to person, place, and time.  Skin: Skin is warm and dry. No rash noted. She is not diaphoretic.  Psychiatric: Memory, affect and judgment normal.    Lab Results  Component Value Date   TSH 2.00 09/25/2011   Lab Results  Component Value Date   WBC 7.4 01/23/2012   HGB 14.3 01/23/2012   HCT 43.7 01/23/2012   MCV 87.1 01/23/2012   PLT 215.0 01/23/2012   Lab Results  Component Value Date   CREATININE 0.6 01/23/2012   BUN 14 01/23/2012   NA 141 01/23/2012   K 4.0 01/23/2012   CL 107 01/23/2012   CO2 27 01/23/2012   Lab Results  Component Value Date   ALT 23 01/23/2012   AST 17 01/23/2012   ALKPHOS 67 01/23/2012   BILITOT 0.5 01/23/2012   Lab Results  Component Value Date   CHOL 218* 01/23/2012   Lab Results  Component Value Date   HDL 47.20 01/23/2012   No results found for this basename: [facility]   Lab Results  Component Value Date   TRIG 87.0 01/23/2012   Lab Results  Component Value Date   CHOLHDL 5 01/23/2012     Assessment & Plan  Overweight Encouraged DASH diet, decrease po intake and increase exercise as tolerated. Needs 7-8 hours of sleep nightly. Avoid trans fats, eat small, frequent meals every 4-5 hours with lean proteins, complex carbs and healthy fats. Minimize simple carbs, GMO foods.  Diabetes mellitus hgba1cnot  acceptable, minimize simple carbs. Increase exercise as tolerated.  Continue current meds increased to bid  Elevated BP Well controlled, no changes to meds. Encouraged heart healthy diet such as the DASH diet and exercise as tolerated.   Hyperlipidemia Encouraged heart healthy diet, increase exercise, avoid trans fats, consider a krill oil cap daily  Esophageal reflux Avoid offending foods, start probiotics. Do not eat large meals in late evening and consider raising head of bed.

## 2014-05-16 NOTE — Patient Instructions (Addendum)
3 month visit 6 month visit is annual with GYN     Back Pain, Adult   [place] twice a day   Low back pain is very common. About 1 in 5 people have back pain.The cause of low back pain is rarely dangerous. The pain often gets better over time.About half of people with a sudden onset of back pain feel better in just 2 weeks. About 8 in 10 people feel better by 6 weeks.  CAUSES Some common causes of back pain include:  Strain of the muscles or ligaments supporting the spine.  Wear and tear (degeneration) of the spinal discs.  Arthritis.  Direct injury to the back. DIAGNOSIS Most of the time, the direct cause of low back pain is not known.However, back pain can be treated effectively even when the exact cause of the pain is unknown.Answering your caregiver's questions about your overall health and symptoms is one of the most accurate ways to make sure the cause of your pain is not dangerous. If your caregiver needs more information, he or she may order lab work or imaging tests (X-rays or MRIs).However, even if imaging tests show changes in your back, this usually does not require surgery. HOME CARE INSTRUCTIONS For many people, back pain returns.Since low back pain is rarely dangerous, it is often a condition that people can learn to [facility] their own.   Remain active. It is stressful on the back to sit or stand in one place. Do not sit, drive, or stand in one place for more than 30 minutes at a time. Take short walks on level surfaces as soon as pain allows.Try to increase the length of time you walk each day.  Do not stay in bed.Resting more than 1 or 2 days can delay your recovery.  Do not avoid exercise or work.Your body is made to move.It is not dangerous to be active, even though your back may hurt.Your back will likely heal faster if you return to being active before your pain is gone.  Pay attention to your body when you bend and lift. Many people have  less discomfortwhen lifting if they bend their knees, keep the load close to their bodies,and avoid twisting. Often, the most comfortable positions are those that put less stress on your recovering back.  Find a comfortable position to sleep. Use a firm mattress and lie on your side with your knees slightly bent. If you lie on your back, put a pillow under your knees.  Only take over-the-counter or prescription medicines as directed by your caregiver. Over-the-counter medicines to reduce pain and inflammation are often the most helpful.Your caregiver may prescribe muscle relaxant drugs.These medicines help dull your pain so you can more quickly return to your normal activities and healthy exercise.  Put ice on the injured area.  Put ice in a plastic bag.  Place a towel between your skin and the bag.  Leave the ice on for 15-20 minutes, 03-04 times a day for the first 2 to 3 days. After that, ice and heat may be alternated to reduce pain and spasms.  Ask your caregiver about trying back exercises and gentle massage. This may be of some benefit.  Avoid feeling anxious or stressed.Stress increases muscle tension and can worsen back pain.It is important to recognize when you are anxious or stressed and learn ways to manage it.Exercise is a great option. SEEK MEDICAL CARE IF:  You have pain that is not relieved with rest or medicine.  You have pain that does not improve in 1 week.  You have new symptoms.  You are generally not feeling well. SEEK IMMEDIATE MEDICAL CARE IF:   You have pain that radiates from your back into your legs.  You develop new bowel or bladder control problems.  You have unusual weakness or numbness in your arms or legs.  You develop nausea or vomiting.  You develop abdominal pain.  You feel faint. Document Released: 08/05/2005 Document Revised: 02/04/2012 Document Reviewed: 12/07/2013 [facility] Patient Information 2015 [place], [place]. This information  is not intended to replace advice given to you by your health care provider. Make sure you discuss any questions you have with your health care provider.

## 2014-05-16 NOTE — Progress Notes (Signed)
Pre visit review using our clinic review tool, if applicable. No additional management support is needed unless otherwise documented below in the visit note. 

## 2014-05-17 ENCOUNTER — Encounter: Payer: Self-pay | Admitting: Family Medicine

## 2014-05-17 ENCOUNTER — Telehealth: Payer: Self-pay | Admitting: Family Medicine

## 2014-05-17 MED ORDER — METFORMIN HCL 500 MG PO TABS: 500.0000 mg | ORAL_TABLET | Freq: Two times a day (BID) | ORAL | Status: DC

## 2014-05-17 MED ORDER — GLUCOSE BLOOD VI STRP: 1.0000 | ORAL_STRIP | Status: AC | PRN

## 2014-05-17 NOTE — Telephone Encounter (Signed)
Please advise? Send in meter and test strips?

## 2014-05-17 NOTE — Telephone Encounter (Signed)
Metformin was sent (see labs)  Sent pt a mychart message with Dr Elby ShowersBlyths suggestion

## 2014-05-17 NOTE — Telephone Encounter (Signed)
So all the meters easily available you have to prick your finger. She should check with the pharmacy to see what is covered best with her insurance and we can prescribe. Other wise I will end up writing for 3 different meters before we figure out which one is cost effective

## 2014-05-17 NOTE — Telephone Encounter (Signed)
Requesting refill on testing Strips and wants a new meter that she doesn't have to prick her finger daily

## 2014-05-17 NOTE — Telephone Encounter (Signed)
Caller name: Clydie BraunKaren Relation to pt: Call back number:   [phone] Pharmacy:  Reason for call:  Pt is inquiring about the meformin that was mention in her lab results. And also about the test strips.  Please advise.

## 2014-05-18 ENCOUNTER — Encounter: Payer: Self-pay | Admitting: Family Medicine

## 2014-05-18 DIAGNOSIS — K219 Gastro-esophageal reflux disease without esophagitis: Secondary | ICD-10-CM

## 2014-05-18 HISTORY — DX: Gastro-esophageal reflux disease without esophagitis: K21.9

## 2014-05-18 NOTE — Assessment & Plan Note (Signed)
Encouraged heart healthy diet, increase exercise, avoid trans fats, consider a krill oil cap daily 

## 2014-05-18 NOTE — Assessment & Plan Note (Signed)
Encouraged DASH diet, decrease po intake and increase exercise as tolerated. Needs 7-8 hours of sleep nightly. Avoid trans fats, eat small, frequent meals every 4-5 hours with lean proteins, complex carbs and healthy fats. Minimize simple carbs, GMO foods. 

## 2014-05-18 NOTE — Assessment & Plan Note (Signed)
hgba1cnot  acceptable, minimize simple carbs. Increase exercise as tolerated. Continue current meds increased to bid

## 2014-05-18 NOTE — Assessment & Plan Note (Signed)
Avoid offending foods, start probiotics. Do not eat large meals in late evening and consider raising head of bed.  

## 2014-05-18 NOTE — Assessment & Plan Note (Signed)
Well controlled, no changes to meds. Encouraged heart healthy diet such as the DASH diet and exercise as tolerated.  °

## 2014-07-28 ENCOUNTER — Encounter: Payer: Self-pay | Admitting: Family Medicine

## 2014-08-10 ENCOUNTER — Telehealth: Payer: Self-pay | Admitting: *Deleted

## 2014-08-10 NOTE — Telephone Encounter (Signed)
Patient called and left message with [facility] that she would like to cancel her appt on 08/16/2014 with Dr. Abner GreenspanBlyth and will reschedule at a later date. JG//CMA

## 2014-08-16 ENCOUNTER — Ambulatory Visit: Payer: BC Managed Care – PPO | Admitting: Family Medicine

## 2014-09-22 ENCOUNTER — Encounter: Payer: Self-pay | Admitting: Medical

## 2014-09-22 ENCOUNTER — Ambulatory Visit (INDEPENDENT_AMBULATORY_CARE_PROVIDER_SITE_OTHER): Payer: BLUE CROSS/BLUE SHIELD | Admitting: Medical

## 2014-09-22 VITALS — BP 141/95 | HR 108 | Temp 98.8°F | Ht 65.75 in | Wt 195.2 lb

## 2014-09-22 DIAGNOSIS — F4541 Pain disorder exclusively related to psychological factors: Secondary | ICD-10-CM | POA: Insufficient documentation

## 2014-09-22 DIAGNOSIS — J0100 Acute maxillary sinusitis, unspecified: Secondary | ICD-10-CM

## 2014-09-22 MED ORDER — FLUTICASONE PROPIONATE 50 MCG/ACT NA SUSP: 2.0000 | Freq: Every day | NASAL | Status: DC

## 2014-09-22 MED ORDER — AMOXICILLIN-POT CLAVULANATE 875-125 MG PO TABS: 1.0000 | ORAL_TABLET | Freq: Two times a day (BID) | ORAL | Status: DC

## 2014-09-22 NOTE — Patient Instructions (Addendum)
Sinusitis, acute maxillary Your appear to have a sinus infection post mild uri. I am prescribing  augmentin antibiotic for the infection. To help with the nasal congestion I prescribed flonase nasal steroid.   You appear to have some lt om as well with associated lymph node enlarge from both sinus and ear infectin  Rest, hydrate, tylenol for fever.  Follow up in 10-14 days or as needed. To check lymph node.

## 2014-09-22 NOTE — Assessment & Plan Note (Addendum)
Your appear to have a sinus infection post mild uri. I am prescribing  augmentin antibiotic for the infection. To help with the nasal congestion I prescribed flonase nasal steroid.   You appear to have some lt om as well with associated lymph node enlarge from both sinus and ear infectin  Rest, hydrate, tylenol for fever.  Follow up in 10-14 days or as needed. To check lymph node.

## 2014-09-22 NOTE — Progress Notes (Signed)
Subjective:    Patient ID: Alfredia ClientKaren Hoogendoorn, female    DOB: January 22, 1966, 49 y.o.   MRN: [id number]  HPI   Pt in with some recent nasal congestion for 7-10 days. Some pressure around her eyes/sinus. Some sneezing. Some purulent nasal drainage. No fevers or chills. No cough. Feels mild off balance on and off and she attributes to her sinuses.  Pt point to left side of neck. Mild sore area which she thinks may be a lymph node.   Review of Systems  Constitutional: Negative for fever, chills and fatigue.  HENT: Positive for congestion, sinus pressure and sneezing. Negative for ear pain, postnasal drip, sore throat and tinnitus.   Respiratory: Negative for cough, shortness of breath and wheezing.   Musculoskeletal: Negative for back pain.       No nuccal rigidity.  Neurological: Positive for light-headedness. Negative for dizziness, facial asymmetry and headaches.       Mild occasional light headed sensation.none presently.  Hematological: Positive for adenopathy. Does not bruise/bleed easily.  Psychiatric/Behavioral: Negative for behavioral problems and confusion.   Past Medical History  Diagnosis Date  . Chicken pox as a child  . Mumps as a child  . Hyperglycemia   . Hyperlipidemia   . Elevated BP   . Overweight(278.02)   . Preventative health care 09/25/2011  . Fatigue 09/25/2011  . Diabetes mellitus 1994    gestational   . GDM (gestational diabetes mellitus)   . Diabetes mellitus   . CTS (carpal tunnel syndrome) 01/23/2012  . Esophageal reflux 05/18/2014    History   Social History  . Marital Status: Divorced    Spouse Name: N/A    Number of Children: N/A  . Years of Education: N/A   Occupational History  . Not on file.   Social History Main Topics  . Smoking status: Never Smoker   . Smokeless tobacco: Never Used  . Alcohol Use: Yes     Comment: maybe once a month  . Drug Use: No  . Sexual Activity: No   Other Topics Concern  . Not on file   Social History  Narrative    Past Surgical History  Procedure Laterality Date  . Appendectomy      high school  . Ectopic pregnancy surgery  2004    left side?    Family History  Problem Relation Age of Onset  . Cancer Mother     colon  . Diabetes Father     type 2  . Heart disease Father     open heart surgery,triple bypass  . Leukemia Father   . Cancer Father     CLL  . Stroke Maternal Grandmother     X several  . Hypertension Maternal Grandmother   . Emphysema Maternal Grandfather     smoker  . Heart attack Paternal Grandfather   . Heart disease Paternal Grandfather     MI, sudden death  . Endometriosis Sister     Allergies  Allergen Reactions  . Sulfa Antibiotics Hives    Current Outpatient Prescriptions on File Prior to Visit  Medication Sig Dispense Refill  . fish oil-omega-3 fatty acids 1000 MG capsule Take 2 g by mouth daily.    Marland Kitchen. glucose blood (FREESTYLE LITE) test strip 1 each by Other route as needed for other. 100 each 4  . Krill Oil CAPS Take 1 capsule by mouth daily. 30 capsule 0  . metFORMIN (GLUCOPHAGE) 500 MG tablet Take 1 tablet (500 mg total) by  mouth 2 (two) times daily with a meal. 60 tablet 3  . Multiple Vitamin (MULTIVITAMIN) tablet Take 1 tablet by mouth daily.    . niacin 500 MG CR capsule 1 cap po qhs x 1 week and if tolerated increase to 2 tabs po at bedtime. Take a lowfat snack and an 81 mg aspirin 1/2 hour prior 30 capsule 0   No current facility-administered medications on file prior to visit.    BP 141/95 mmHg  Pulse 108  Temp(Src) 98.8 F (37.1 C) (Oral)  Ht 5' 5.75" (1.67 m)  Wt 195 lb 3.2 oz (88.542 kg)  BMI 31.75 kg/m2  SpO2 97%      Objective:   Physical Exam  General  Mental Status - Alert. General Appearance - Well groomed. Not in acute distress.  Skin Rashes- No Rashes.  HEENT Head- Normal. Ear Auditory Canal - Left- Normal. Right - Normal.Tympanic Membrane- Left- Normal. Right- Normal. Eye Sclera/Conjunctiva- Left-  Normal. Right- Normal. Nose & Sinuses Nasal Mucosa- Left-  Boggy and Congested. Right-  Boggy and  Congested.Lt  Maxillary sinus pressure  but no frontal sinus pressure. Mouth & Throat Lips: Upper Lip- Normal: no dryness, cracking, pallor, cyanosis, or vesicular eruption. Lower Lip-Normal: no dryness, cracking, pallor, cyanosis or vesicular eruption. Buccal Mucosa- Bilateral- No Aphthous ulcers. Oropharynx- No Discharge or Erythema. Tonsils: Characteristics- Bilateral- No Erythema or Congestion. Size/Enlargement- Bilateral- No enlargement. Discharge- bilateral-None.  Neck Neck- Supple. No Masses. No nuccal rigidity. Lt side neck. Small tender posterior cervical lymph node beneaht the ear.   Chest and Lung Exam Auscultation: Breath Sounds:-Clear even and unlabored.  Cardiovascular Auscultation:Rythm- Regular, rate and rhythm. Murmurs & Other Heart Sounds:Ausculatation of the heart reveal- No Murmurs.  Lymphatic Head & Neck General Head & Neck Lymphatics: Bilateral: Description- No Localized lymphadenopathy.  Neuro- CN II- XII intact.       Assessment & Plan:

## 2014-09-22 NOTE — Progress Notes (Signed)
Pre visit review using our clinic review tool, if applicable. No additional management support is needed unless otherwise documented below in the visit note. 

## 2014-11-03 ENCOUNTER — Other Ambulatory Visit: Payer: Self-pay | Admitting: Family Medicine

## 2014-11-14 ENCOUNTER — Ambulatory Visit: Payer: BC Managed Care – PPO | Admitting: Family Medicine

## 2015-02-27 ENCOUNTER — Ambulatory Visit: Payer: Self-pay | Admitting: Family Medicine

## 2015-06-05 ENCOUNTER — Ambulatory Visit (INDEPENDENT_AMBULATORY_CARE_PROVIDER_SITE_OTHER): Payer: BLUE CROSS/BLUE SHIELD | Admitting: Family Medicine

## 2015-06-05 ENCOUNTER — Encounter: Payer: Self-pay | Admitting: Family Medicine

## 2015-06-05 VITALS — BP 120/80 | HR 84 | Temp 97.8°F | Ht 67.0 in | Wt 189.6 lb

## 2015-06-05 DIAGNOSIS — E785 Hyperlipidemia, unspecified: Secondary | ICD-10-CM | POA: Diagnosis not present

## 2015-06-05 DIAGNOSIS — Z1239 Encounter for other screening for malignant neoplasm of breast: Secondary | ICD-10-CM

## 2015-06-05 DIAGNOSIS — E663 Overweight: Secondary | ICD-10-CM | POA: Diagnosis not present

## 2015-06-05 DIAGNOSIS — R030 Elevated blood-pressure reading, without diagnosis of hypertension: Secondary | ICD-10-CM | POA: Diagnosis not present

## 2015-06-05 DIAGNOSIS — Z23 Encounter for immunization: Secondary | ICD-10-CM | POA: Diagnosis not present

## 2015-06-05 DIAGNOSIS — Z0000 Encounter for general adult medical examination without abnormal findings: Secondary | ICD-10-CM

## 2015-06-05 DIAGNOSIS — E119 Type 2 diabetes mellitus without complications: Secondary | ICD-10-CM | POA: Diagnosis not present

## 2015-06-05 DIAGNOSIS — IMO0001 Reserved for inherently not codable concepts without codable children: Secondary | ICD-10-CM

## 2015-06-05 DIAGNOSIS — Z1211 Encounter for screening for malignant neoplasm of colon: Secondary | ICD-10-CM

## 2015-06-05 DIAGNOSIS — Z800 Family history of malignant neoplasm of digestive organs: Secondary | ICD-10-CM

## 2015-06-05 LAB — COMPREHENSIVE METABOLIC PANEL
ALK PHOS: 80 U/L (ref 39–117)
ALT: 22 U/L (ref 0–35)
AST: 17 U/L (ref 0–37)
Albumin: 4.3 g/dL (ref 3.5–5.2)
BUN: 13 mg/dL (ref 6–23)
CHLORIDE: 105 meq/L (ref 96–112)
CO2: 25 mEq/L (ref 19–32)
Calcium: 9.9 mg/dL (ref 8.4–10.5)
Creatinine, Ser: 0.8 mg/dL (ref 0.40–1.20)
GFR: 80.22 mL/min (ref >60.00)
GLUCOSE: 183 mg/dL — AB (ref 70–99)
POTASSIUM: 4.6 meq/L (ref 3.5–5.1)
SODIUM: 141 meq/L (ref 135–145)
TOTAL PROTEIN: 7.3 g/dL (ref 6.0–8.3)
Total Bilirubin: 0.4 mg/dL (ref 0.2–1.2)

## 2015-06-05 LAB — CBC
HCT: 46.3 % — ABNORMAL HIGH (ref 36.0–46.0)
Hemoglobin: 15.3 g/dL — ABNORMAL HIGH (ref 12.0–15.0)
MCHC: 33 g/dL (ref 30.0–36.0)
MCV: 85.6 fl (ref 78.0–100.0)
Platelets: 253 10*3/uL (ref 150.0–400.0)
RBC: 5.41 Mil/uL — AB (ref 3.87–5.11)
RDW: 13.7 % (ref 11.5–15.5)
WBC: 8.9 10*3/uL (ref 4.0–10.5)

## 2015-06-05 LAB — LIPID PANEL
Cholesterol: 191 mg/dL (ref 0–200)
HDL: 43.4 mg/dL (ref >39.00)
LDL CALC: 125 mg/dL — AB (ref 0–99)
NONHDL: 148.08
Total CHOL/HDL Ratio: 4
Triglycerides: 115 mg/dL (ref 0.0–149.0)
VLDL: 23 mg/dL (ref 0.0–40.0)

## 2015-06-05 LAB — MICROALBUMIN / CREATININE URINE RATIO
Creatinine,U: 120.6 mg/dL
Microalb Creat Ratio: 0.6 mg/g (ref 0.0–30.0)

## 2015-06-05 LAB — HEMOGLOBIN A1C: HEMOGLOBIN A1C: 8.4 % — AB (ref 4.6–6.5)

## 2015-06-05 LAB — TSH: TSH: 2.36 u[IU]/mL (ref 0.35–4.50)

## 2015-06-05 MED ORDER — METFORMIN HCL 500 MG PO TABS: ORAL_TABLET | ORAL | Status: DC

## 2015-06-05 NOTE — Progress Notes (Signed)
Pre visit review using our clinic review tool, if applicable. No additional management support is needed unless otherwise documented below in the visit note. 

## 2015-06-05 NOTE — Patient Instructions (Addendum)
Ask insurance about HIV and Hep C screening, for preventative purposes per CDC recommendations  Basic Carbohydrate Counting for Diabetes Mellitus Carbohydrate counting is a method for keeping track of the amount of carbohydrates you eat. Eating carbohydrates naturally increases the level of sugar (glucose) in your blood, so it is important for you to know the amount that is okay for you to have in every meal. Carbohydrate counting helps keep the level of glucose in your blood within normal limits. The amount of carbohydrates allowed is different for every person. A dietitian can help you calculate the amount that is right for you. Once you know the amount of carbohydrates you can have, you can count the carbohydrates in the foods you want to eat. Carbohydrates are found in the following foods:  Grains, such as breads and cereals.  Dried beans and soy products.  Starchy vegetables, such as potatoes, peas, and corn.  Fruit and fruit juices.  Milk and yogurt.  Sweets and snack foods, such as cake, cookies, candy, chips, soft drinks, and fruit drinks. CARBOHYDRATE COUNTING There are two ways to count the carbohydrates in your food. You can use either of the methods or a combination of both. Reading the "Nutrition Facts" on Packaged Food The "Nutrition Facts" is an area that is included on the labels of almost all packaged food and beverages in the [place]. It includes the serving size of that food or beverage and information about the nutrients in each serving of the food, including the grams (g) of carbohydrate per serving.  Decide the number of servings of this food or beverage that you will be able to eat or drink. Multiply that number of servings by the number of grams of carbohydrate that is listed on the label for that serving. The total will be the amount of carbohydrates you will be having when you eat or drink this food or beverage. Learning Standard Serving Sizes of Food When you  eat food that is not packaged or does not include "Nutrition Facts" on the label, you need to measure the servings in order to count the amount of carbohydrates.A serving of most carbohydrate-rich foods contains about 15 g of carbohydrates. The following list includes serving sizes of carbohydrate-rich foods that provide 15 g ofcarbohydrate per serving:   1 slice of bread (1 oz) or 1 six-inch tortilla.    of a hamburger bun or English muffin.  4-6 crackers.   cup unsweetened dry cereal.    cup hot cereal.   cup rice or pasta.    cup mashed potatoes or  of a large baked potato.  1 cup fresh fruit or one small piece of fruit.    cup canned or frozen fruit or fruit juice.  1 cup milk.   cup plain fat-free yogurt or yogurt sweetened with artificial sweeteners.   cup cooked dried beans or starchy vegetable, such as peas, corn, or potatoes.  Decide the number of standard-size servings that you will eat. Multiply that number of servings by 15 (the grams of carbohydrates in that serving). For example, if you eat 2 cups of strawberries, you will have eaten 2 servings and 30 g of carbohydrates (2 servings x 15 g = 30 g). For foods such as soups and casseroles, in which more than one food is mixed in, you will need to count the carbohydrates in each food that is included. EXAMPLE OF CARBOHYDRATE COUNTING Sample Dinner  3 oz chicken breast.   cup of brown rice.  cup of corn.  1 cup milk.   1 cup strawberries with sugar-free whipped topping.  Carbohydrate Calculation Step 1: Identify the foods that contain carbohydrates:   Rice.   Corn.   Milk.   Strawberries. Step 2:Calculate the number of servings eaten of each:   2 servings of rice.   1 serving of corn.   1 serving of milk.   1 serving of strawberries. Step 3: Multiply each of those number of servings by 15 g:   2 servings of rice x 15 g = 30 g.   1 serving of corn x 15 g = 15 g.   1  serving of milk x 15 g = 15 g.   1 serving of strawberries x 15 g = 15 g. Step 4: Add together all of the amounts to find the total grams of carbohydrates eaten: 30 g + 15 g + 15 g + 15 g = 75 g.   This information is not intended to replace advice given to you by your health care provider. Make sure you discuss any questions you have with your health care provider.   Document Released: 08/05/2005 Document Revised: 08/26/2014 Document Reviewed: 07/02/2013 Elsevier Interactive Patient Education [employer].

## 2015-06-05 NOTE — Assessment & Plan Note (Signed)
Well controlled. Encouraged heart healthy diet such as the DASH diet and exercise as tolerated.  

## 2015-06-05 NOTE — Assessment & Plan Note (Signed)
Encouraged DASH diet, decrease po intake and increase exercise as tolerated. Needs 7-8 hours of sleep nightly. Avoid trans fats, eat small, frequent meals every 4-5 hours with lean proteins, complex carbs and healthy fats. Minimize simple carbs 

## 2015-06-05 NOTE — Assessment & Plan Note (Signed)
Encouraged heart healthy diet, increase exercise, avoid trans fats, consider a krill oil cap daily 

## 2015-06-05 NOTE — Assessment & Plan Note (Signed)
hgba1c acceptable, minimize simple carbs. Increase exercise as tolerated. Continue current meds 

## 2015-06-06 ENCOUNTER — Encounter: Payer: Self-pay | Admitting: Family Medicine

## 2015-06-06 ENCOUNTER — Other Ambulatory Visit: Payer: Self-pay | Admitting: Family Medicine

## 2015-06-06 MED ORDER — METFORMIN HCL ER (MOD) 1000 MG PO TB24: 1000.0000 mg | ORAL_TABLET | Freq: Two times a day (BID) | ORAL | Status: DC

## 2015-06-06 NOTE — Telephone Encounter (Signed)
Sent in metformin increase as you instructed.  The pharmacy called questioning if correct due to increase.  Advise is correct please.  Will call back pharmacy to let them know. [phone] (Pharmacist Leonie ManLilia)

## 2015-06-09 ENCOUNTER — Telehealth: Payer: Self-pay | Admitting: Family Medicine

## 2015-06-09 NOTE — Telephone Encounter (Signed)
Erin with [facility] in [place] Ph# [phone]  Pharmacy needs to know if pt needs GLUMETZA or regular metformin. Please call asap.

## 2015-06-09 NOTE — Telephone Encounter (Signed)
Faxed per PCP's instructions yesterday 06/08/15 regarding this medication. Faxed again today 06/09/15

## 2015-06-11 NOTE — Progress Notes (Signed)
Subjective:    Patient ID: Jennifer Odom, female    DOB: 1966-05-18, 49 y.o.   MRN: [id number]  Chief Complaint  Patient presents with  . Follow-up    HPI Patient is in today for follow-up. Blood sugars are ranging between 129 and 1:30 9 in the morning. No polyuria or polydipsia. Did not tolerate taking 2 metformin a one-time secondary to it causing nausea and diarrhea. No recent illness. Denies CP/palp/SOB/HA/congestion/fevers/GI or GU c/o. Taking meds as prescribed  Past Medical History  Diagnosis Date  . Chicken pox as a child  . Mumps as a child  . Hyperglycemia   . Hyperlipidemia   . Elevated BP   . Overweight(278.02)   . Preventative health care 09/25/2011  . Fatigue 09/25/2011  . Diabetes mellitus 1994    gestational   . GDM (gestational diabetes mellitus)   . Diabetes mellitus   . CTS (carpal tunnel syndrome) 01/23/2012  . Esophageal reflux 05/18/2014    Past Surgical History  Procedure Laterality Date  . Appendectomy      high school  . Ectopic pregnancy surgery  2004    left side?    Family History  Problem Relation Age of Onset  . Cancer Mother     colon  . Diabetes Father     type 2  . Heart disease Father     open heart surgery,triple bypass  . Leukemia Father   . Cancer Father     CLL  . Stroke Maternal Grandmother     X several  . Hypertension Maternal Grandmother   . Emphysema Maternal Grandfather     smoker  . Heart attack Paternal Grandfather   . Heart disease Paternal Grandfather     MI, sudden death  . Endometriosis Sister     Social History   Social History  . Marital Status: Divorced    Spouse Name: N/A  . Number of Children: N/A  . Years of Education: N/A   Occupational History  . Not on file.   Social History Main Topics  . Smoking status: Never Smoker   . Smokeless tobacco: Never Used  . Alcohol Use: Yes     Comment: maybe once a month  . Drug Use: No  . Sexual Activity: No   Other Topics Concern  . Not on file    Social History Narrative    Outpatient Prescriptions Prior to Visit  Medication Sig Dispense Refill  . fish oil-omega-3 fatty acids 1000 MG capsule Take 2 g by mouth daily.    Marland Kitchen glucose blood (FREESTYLE LITE) test strip 1 each by Other route as needed for other. 100 each 4  . Krill Oil CAPS Take 1 capsule by mouth daily. 30 capsule 0  . Multiple Vitamin (MULTIVITAMIN) tablet Take 1 tablet by mouth daily.    . metFORMIN (GLUCOPHAGE) 500 MG tablet TAKE 1 TABLET BY MOUTH TWICE DAILY WITH A MEAL 60 tablet 6  . niacin 500 MG CR capsule 1 cap po qhs x 1 week and if tolerated increase to 2 tabs po at bedtime. Take a lowfat snack and an 81 mg aspirin 1/2 hour prior (Patient not taking: Reported on 06/05/2015) 30 capsule 0  . amoxicillin-clavulanate (AUGMENTIN) 875-125 MG per tablet Take 1 tablet by mouth 2 (two) times daily. 20 tablet 0  . fluticasone (FLONASE) 50 MCG/ACT nasal spray Place 2 sprays into both nostrils daily. 16 g 1   No facility-administered medications prior to visit.    Allergies  Allergen Reactions  . Sulfa Antibiotics Hives    Review of Systems  Constitutional: Negative for fever and malaise/fatigue.  HENT: Negative for congestion.   Eyes: Negative for discharge.  Respiratory: Negative for shortness of breath.   Cardiovascular: Negative for chest pain, palpitations and leg swelling.  Gastrointestinal: Negative for nausea and abdominal pain.  Genitourinary: Negative for dysuria.  Musculoskeletal: Negative for falls.  Skin: Negative for rash.  Neurological: Negative for loss of consciousness and headaches.  Endo/Heme/Allergies: Negative for environmental allergies.  Psychiatric/Behavioral: Negative for depression. The patient is not nervous/anxious.        Objective:    Physical Exam  Constitutional: She is oriented to person, place, and time. She appears well-developed and well-nourished. No distress.  HENT:  Head: Normocephalic and atraumatic.  Nose: Nose  normal.  Eyes: Right eye exhibits no discharge. Left eye exhibits no discharge.  Neck: Normal range of motion. Neck supple.  Cardiovascular: Normal rate and regular rhythm.   No murmur heard. Pulmonary/Chest: Effort normal and breath sounds normal.  Abdominal: Soft. Bowel sounds are normal. There is no tenderness.  Musculoskeletal: She exhibits no edema.  Neurological: She is alert and oriented to person, place, and time.  Skin: Skin is warm and dry.  Psychiatric: She has a normal mood and affect.  Nursing note and vitals reviewed.   BP 120/80 mmHg  Pulse 84  Temp(Src) 97.8 F (36.6 C) (Oral)  Ht  (1.702 m)  Wt 189 lb 9 oz (85.985 kg)  BMI 29.68 kg/m2  SpO2 94% Wt Readings from Last 3 Encounters:  06/05/15 189 lb 9 oz (85.985 kg)  09/22/14 195 lb 3.2 oz (88.542 kg)  05/16/14 193 lb 12.8 oz (87.907 kg)     Lab Results  Component Value Date   WBC 8.9 06/05/2015   HGB 15.3* 06/05/2015   HCT 46.3* 06/05/2015   PLT 253.0 06/05/2015   GLUCOSE 183* 06/05/2015   CHOL 191 06/05/2015   TRIG 115.0 06/05/2015   HDL 43.40 06/05/2015   LDLDIRECT 163.9 01/23/2012   LDLCALC 125* 06/05/2015   ALT 22 06/05/2015   AST 17 06/05/2015   NA 141 06/05/2015   K 4.6 06/05/2015   CL 105 06/05/2015   CREATININE 0.80 06/05/2015   BUN 13 06/05/2015   CO2 25 06/05/2015   TSH 2.36 06/05/2015   HGBA1C 8.4* 06/05/2015   MICROALBUR <0.7 06/05/2015    Lab Results  Component Value Date   TSH 2.36 06/05/2015   Lab Results  Component Value Date   WBC 8.9 06/05/2015   HGB 15.3* 06/05/2015   HCT 46.3* 06/05/2015   MCV 85.6 06/05/2015   PLT 253.0 06/05/2015   Lab Results  Component Value Date   NA 141 06/05/2015   K 4.6 06/05/2015   CO2 25 06/05/2015   GLUCOSE 183* 06/05/2015   BUN 13 06/05/2015   CREATININE 0.80 06/05/2015   BILITOT 0.4 06/05/2015   ALKPHOS 80 06/05/2015   AST 17 06/05/2015   ALT 22 06/05/2015   PROT 7.3 06/05/2015   ALBUMIN 4.3 06/05/2015   CALCIUM 9.9  06/05/2015   GFR 80.22 06/05/2015   Lab Results  Component Value Date   CHOL 191 06/05/2015   Lab Results  Component Value Date   HDL 43.40 06/05/2015   Lab Results  Component Value Date   LDLCALC 125* 06/05/2015   Lab Results  Component Value Date   TRIG 115.0 06/05/2015   Lab Results  Component Value Date   CHOLHDL 4  06/05/2015   Lab Results  Component Value Date   HGBA1C 8.4* 06/05/2015       Assessment & Plan:   Problem List Items Addressed This Visit    Preventative health care   Overweight    Encouraged DASH diet, decrease po intake and increase exercise as tolerated. Needs 7-8 hours of sleep nightly. Avoid trans fats, eat small, frequent meals every 4-5 hours with lean proteins, complex carbs and healthy fats. Minimize simple carbs      Relevant Orders   TSH (Completed)   CBC (Completed)   Hemoglobin A1c (Completed)   Comprehensive metabolic panel (Completed)   Lipid panel (Completed)   Microalbumin / creatinine urine ratio (Completed)   Hyperlipidemia    Encouraged heart healthy diet, increase exercise, avoid trans fats, consider a krill oil cap daily      Relevant Orders   TSH (Completed)   CBC (Completed)   Hemoglobin A1c (Completed)   Comprehensive metabolic panel (Completed)   Lipid panel (Completed)   Microalbumin / creatinine urine ratio (Completed)   Elevated BP    Well controlledEncouraged heart healthy diet such as the DASH diet and exercise as tolerated.       Diabetes mellitus (HCC) - Primary    hgba1c acceptable, minimize simple carbs. Increase exercise as tolerated. Continue current meds      Relevant Orders   TSH (Completed)   CBC (Completed)   Hemoglobin A1c (Completed)   Comprehensive metabolic panel (Completed)   Lipid panel (Completed)   Microalbumin / creatinine urine ratio (Completed)    Other Visit Diagnoses    Breast cancer screening        Relevant Orders    MM Digital Screening    FH: colon cancer         Relevant Orders    Ambulatory referral to Gastroenterology    Colon cancer screening        Relevant Orders    Ambulatory referral to Gastroenterology    Need for vaccination with 13-polyvalent pneumococcal conjugate vaccine        Relevant Orders    Pneumococcal conjugate vaccine 13-valent (Completed)       I have discontinued Ms. Edgett's amoxicillin-clavulanate, fluticasone, and metFORMIN. I am also having her maintain her multivitamin, fish oil-omega-3 fatty acids, Krill Oil, niacin, and glucose blood.  Meds ordered this encounter  Medications  . DISCONTD: metFORMIN (GLUCOPHAGE) 500 MG tablet    Sig: TAKE 1 TABLET BY MOUTH TWICE DAILY WITH A MEAL    Dispense:  60 tablet    Refill:  6     Danise EdgeBLYTH, Cason Dabney, MD

## 2015-06-12 NOTE — Telephone Encounter (Signed)
Fax from pharmacy today 06/12/15.  Message states--they are asking you if patient should be on metformin 1000 mg instead of generic Glumetza which is not the same??

## 2015-06-12 NOTE — Telephone Encounter (Signed)
So she just needs some form of extended release Metformin 1000 mg bid, I do not care which version. What ever is on her formulary is fine.

## 2015-06-12 NOTE — Telephone Encounter (Signed)
[facility] of [facility] instructions regarding prescription.  The pharmacy verbalized understanding of what PCP did request for this patient to take and hopefully confusion cleared up.

## 2015-12-04 ENCOUNTER — Encounter: Payer: Self-pay | Admitting: Family Medicine

## 2015-12-04 ENCOUNTER — Telehealth: Payer: Self-pay | Admitting: Family Medicine

## 2015-12-04 NOTE — Telephone Encounter (Signed)
Pt lvm this morning at 6:31 am stating that she had to take her son back to school unexpectedly because he is having car trouble. Pt would not be at her CPE this morning.    Should pt be charged?

## 2015-12-04 NOTE — Telephone Encounter (Signed)
No charge. 

## 2015-12-08 ENCOUNTER — Encounter: Payer: Self-pay | Admitting: Family Medicine

## 2015-12-30 ENCOUNTER — Other Ambulatory Visit: Payer: Self-pay | Admitting: Family Medicine

## 2015-12-31 NOTE — Telephone Encounter (Signed)
She can have a 30 supply with 2 rf or a 90 day supply at her discretion but she needs an appt before that runs out her A1C was up in October and she has not been backr

## 2016-01-31 ENCOUNTER — Other Ambulatory Visit: Payer: Self-pay | Admitting: Family Medicine

## 2016-01-31 NOTE — Telephone Encounter (Signed)
Rx sent to the pharmacy by e-script.  However only given 1 month supply.  The patient needs further evaluation and/or laboratory testing before further refills are given. Ask her to make an appointment for this.//AB/CMA

## 2016-03-05 ENCOUNTER — Other Ambulatory Visit: Payer: Self-pay | Admitting: Family Medicine

## 2016-03-31 ENCOUNTER — Other Ambulatory Visit: Payer: Self-pay | Admitting: Family Medicine

## 2016-03-31 NOTE — Telephone Encounter (Signed)
Can have a one month supply of the metformin but patient really needs a new hgba1c because her A1C was above 8 last October. I am even willing to check an A1C without a visit since I believe she does not have coverage. Of course a visit is best if she is willing

## 2016-08-09 ENCOUNTER — Other Ambulatory Visit: Payer: Self-pay | Admitting: Family Medicine

## 2016-08-09 MED ORDER — METFORMIN HCL ER 500 MG PO TB24: 1000.0000 mg | ORAL_TABLET | Freq: Two times a day (BID) | ORAL | 0 refills | Status: DC

## 2016-09-03 ENCOUNTER — Other Ambulatory Visit: Payer: Self-pay | Admitting: Family Medicine

## 2016-09-30 ENCOUNTER — Other Ambulatory Visit: Payer: Self-pay | Admitting: Family Medicine

## 2016-09-30 NOTE — Telephone Encounter (Signed)
LVM advising patient of message below °

## 2016-09-30 NOTE — Telephone Encounter (Signed)
Per Dr. Abner GreenspanBlyth a 15-day supply of medication was sent into the Patients Pharmacy. No more refills until the Patient comes in for a follow up. Patient last saw Dr. Abner GreenspanBlyth on 06/05/2015

## 2017-09-02 ENCOUNTER — Encounter: Payer: Self-pay | Admitting: Family Medicine

## 2017-09-02 ENCOUNTER — Ambulatory Visit (INDEPENDENT_AMBULATORY_CARE_PROVIDER_SITE_OTHER): Payer: Managed Care, Other (non HMO) | Admitting: Family Medicine

## 2017-09-02 VITALS — BP 120/80 | HR 73 | Temp 97.4°F | Resp 18 | Ht 67.0 in | Wt 176.2 lb

## 2017-09-02 DIAGNOSIS — E663 Overweight: Secondary | ICD-10-CM | POA: Diagnosis not present

## 2017-09-02 DIAGNOSIS — K219 Gastro-esophageal reflux disease without esophagitis: Secondary | ICD-10-CM

## 2017-09-02 DIAGNOSIS — Z0000 Encounter for general adult medical examination without abnormal findings: Secondary | ICD-10-CM | POA: Diagnosis not present

## 2017-09-02 DIAGNOSIS — Z1211 Encounter for screening for malignant neoplasm of colon: Secondary | ICD-10-CM

## 2017-09-02 DIAGNOSIS — E119 Type 2 diabetes mellitus without complications: Secondary | ICD-10-CM | POA: Diagnosis not present

## 2017-09-02 DIAGNOSIS — E785 Hyperlipidemia, unspecified: Secondary | ICD-10-CM

## 2017-09-02 LAB — LIPID PANEL
CHOLESTEROL: 222 mg/dL — AB (ref 0–200)
HDL: 57.7 mg/dL (ref >39.00)
LDL Cholesterol: 140 mg/dL — ABNORMAL HIGH (ref 0–99)
NonHDL: 163.91
TRIGLYCERIDES: 119 mg/dL (ref 0.0–149.0)
Total CHOL/HDL Ratio: 4
VLDL: 23.8 mg/dL (ref 0.0–40.0)

## 2017-09-02 LAB — COMPREHENSIVE METABOLIC PANEL
ALBUMIN: 4.5 g/dL (ref 3.5–5.2)
ALT: 20 U/L (ref 0–35)
AST: 13 U/L (ref 0–37)
Alkaline Phosphatase: 70 U/L (ref 39–117)
BILIRUBIN TOTAL: 0.6 mg/dL (ref 0.2–1.2)
BUN: 16 mg/dL (ref 6–23)
CALCIUM: 9.8 mg/dL (ref 8.4–10.5)
CO2: 28 mEq/L (ref 19–32)
CREATININE: 0.78 mg/dL (ref 0.40–1.20)
Chloride: 104 mEq/L (ref 96–112)
GFR: 81.88 mL/min (ref >60.00)
Glucose, Bld: 217 mg/dL — ABNORMAL HIGH (ref 70–99)
Potassium: 4.5 mEq/L (ref 3.5–5.1)
SODIUM: 138 meq/L (ref 135–145)
TOTAL PROTEIN: 7.2 g/dL (ref 6.0–8.3)

## 2017-09-02 LAB — HEMOGLOBIN A1C: HEMOGLOBIN A1C: 9.6 % — AB (ref 4.6–6.5)

## 2017-09-02 LAB — CBC
HCT: 47.1 % — ABNORMAL HIGH (ref 36.0–46.0)
Hemoglobin: 15.3 g/dL — ABNORMAL HIGH (ref 12.0–15.0)
MCHC: 32.6 g/dL (ref 30.0–36.0)
MCV: 89.7 fl (ref 78.0–100.0)
PLATELETS: 228 10*3/uL (ref 150.0–400.0)
RBC: 5.25 Mil/uL — ABNORMAL HIGH (ref 3.87–5.11)
RDW: 13.5 % (ref 11.5–15.5)
WBC: 7.4 10*3/uL (ref 4.0–10.5)

## 2017-09-02 LAB — TSH: TSH: 2.06 u[IU]/mL (ref 0.35–4.50)

## 2017-09-02 NOTE — Progress Notes (Signed)
Subjective:  I acted as a Neurosurgeon for Dr. Abner Greenspan. Jennifer Odom, Jennifer Odom  Patient ID: Jennifer Odom, female    DOB: 03-16-1966, 52 y.o.   MRN: [id number]  Chief Complaint  Patient presents with  . Annual Exam    HPI  Patient is in today for an annual exam and follow-up on chronic medical concerns including diabetes mellitus and hyperlipidemia.  She feels well today.  No recent febrile illness or acute hospitalizations.  She notes she stopped metformin as it caused her shoulder pain.  It got to where she had trouble lifting things.  Her symptoms improved when she stopped it.  She denies polyuria and polydipsia.  Is trying to maintain a heart healthy diet.  Does well with activities of daily living. .sbh  Patient Care Team: Bradd Canary, MD as PCP - General (Family Medicine)   Past Medical History:  Diagnosis Date  . Chicken pox as a child  . CTS (carpal tunnel syndrome) 01/23/2012  . Diabetes mellitus 1994   gestational   . Diabetes mellitus   . Elevated BP   . Esophageal reflux 05/18/2014  . Fatigue 09/25/2011  . GDM (gestational diabetes mellitus)   . Hyperglycemia   . Hyperlipidemia   . Mumps as a child  . Overweight(278.02)   . Preventative health care 09/25/2011    Past Surgical History:  Procedure Laterality Date  . APPENDECTOMY     high school  . ECTOPIC PREGNANCY SURGERY  2004   left side?    Family History  Problem Relation Age of Onset  . Cancer Mother        colon  . Diabetes Father        type 2  . Heart disease Father        open heart surgery,triple bypass  . Leukemia Father   . Cancer Father        CLL  . Stroke Father   . Stroke Maternal Grandmother        X several  . Hypertension Maternal Grandmother   . Emphysema Maternal Grandfather        smoker  . Heart attack Paternal Grandfather   . Heart disease Paternal Grandfather        MI, sudden death  . Endometriosis Sister   . Cancer Maternal Uncle     Social History   Socioeconomic History  .  Marital status: Divorced    Spouse name: Not on file  . Number of children: Not on file  . Years of education: Not on file  . Highest education level: Not on file  Social Needs  . Financial resource strain: Not on file  . Food insecurity - worry: Not on file  . Food insecurity - inability: Not on file  . Transportation needs - medical: Not on file  . Transportation needs - non-medical: Not on file  Occupational History  . Not on file  Tobacco Use  . Smoking status: Never Smoker  . Smokeless tobacco: Never Used  Substance and Sexual Activity  . Alcohol use: Yes    Comment: maybe once a month  . Drug use: No  . Sexual activity: No  Other Topics Concern  . Not on file  Social History Narrative  . Not on file    Outpatient Medications Prior to Visit  Medication Sig Dispense Refill  . fish oil-omega-3 fatty acids 1000 MG capsule Take 2 g by mouth daily.    Marland Kitchen glucose blood (FREESTYLE LITE) test strip 1  each by Other route as needed for other. 100 each 4  . Krill Oil CAPS Take 1 capsule by mouth daily. 30 capsule 0  . Multiple Vitamin (MULTIVITAMIN) tablet Take 1 tablet by mouth daily.    . niacin 500 MG CR capsule 1 cap po qhs x 1 week and if tolerated increase to 2 tabs po at bedtime. Take a lowfat snack and an 81 mg aspirin 1/2 hour prior 30 capsule 0  . metFORMIN (GLUCOPHAGE-XR) 500 MG 24 hr tablet TAKE 2 TABLETS BY MOUTH TWICE DAILY 30 tablet 0   No facility-administered medications prior to visit.     Allergies  Allergen Reactions  . Sulfa Antibiotics Hives    Review of Systems  Constitutional: Negative for fever and malaise/fatigue.  HENT: Negative for congestion.   Eyes: Negative for blurred vision.  Respiratory: Negative for cough and shortness of breath.   Cardiovascular: Negative for chest pain, palpitations and leg swelling.  Gastrointestinal: Negative for vomiting.  Genitourinary: Negative for dysuria.  Musculoskeletal: Negative for back pain.  Skin:  Negative for rash.  Neurological: Negative for loss of consciousness and headaches.       Objective:    Physical Exam  Constitutional: She is oriented to person, place, and time. She appears well-developed and well-nourished. No distress.  HENT:  Head: Normocephalic and atraumatic.  Eyes: Conjunctivae are normal.  Neck: Normal range of motion. No thyromegaly present.  Cardiovascular: Normal rate and regular rhythm.  Pulmonary/Chest: Effort normal and breath sounds normal. She has no wheezes.  Abdominal: Soft. Bowel sounds are normal. She exhibits no distension. There is no tenderness. There is no rebound and no guarding.  Musculoskeletal: Normal range of motion. She exhibits no edema or deformity.  Neurological: She is alert and oriented to person, place, and time. Coordination normal.  Skin: Skin is warm and dry. No rash noted. She is not diaphoretic.  Psychiatric: She has a normal mood and affect.    BP 120/80 (BP Location: Left Arm, Patient Position: Sitting, Cuff Size: Normal)   Pulse 73   Temp (!) 97.4 F (36.3 C) (Oral)   Resp 18   Ht 5\' 7"  (1.702 m)   Wt 176 lb 3.2 oz (79.9 kg)   SpO2 98%   BMI 27.60 kg/m  Wt Readings from Last 3 Encounters:  09/02/17 176 lb 3.2 oz (79.9 kg)  06/05/15 189 lb 9 oz (86 kg)  09/22/14 195 lb 3.2 oz (88.5 kg)   BP Readings from Last 3 Encounters:  09/02/17 120/80  06/05/15 120/80  09/22/14 (!) 141/95     Immunization History  Administered Date(s) Administered  . Pneumococcal Conjugate-13 06/05/2015  . Tdap 08/19/2005    Health Maintenance  Topic Date Due  . Hepatitis C Screening  January 30, 1966  . PNEUMOCOCCAL POLYSACCHARIDE VACCINE (1) 12/05/1965  . FOOT EXAM  12/05/1973  . OPHTHALMOLOGY EXAM  12/05/1973  . HIV Screening  12/06/1978  . PAP SMEAR  08/20/2011  . MAMMOGRAM  12/05/2013  . COLONOSCOPY  12/05/2013  . TETANUS/TDAP  08/20/2015  . URINE MICROALBUMIN  06/04/2016  . INFLUENZA VACCINE  05/05/2018 (Originally 03/19/2017)    . HEMOGLOBIN A1C  03/02/2018    Lab Results  Component Value Date   WBC 7.4 09/02/2017   HGB 15.3 (H) 09/02/2017   HCT 47.1 (H) 09/02/2017   PLT 228.0 09/02/2017   GLUCOSE 217 (H) 09/02/2017   CHOL 222 (H) 09/02/2017   TRIG 119.0 09/02/2017   HDL 57.70 09/02/2017   LDLDIRECT 163.9  01/23/2012   LDLCALC 140 (H) 09/02/2017   ALT 20 09/02/2017   AST 13 09/02/2017   NA 138 09/02/2017   K 4.5 09/02/2017   CL 104 09/02/2017   CREATININE 0.78 09/02/2017   BUN 16 09/02/2017   CO2 28 09/02/2017   TSH 2.06 09/02/2017   HGBA1C 9.6 (H) 09/02/2017   MICROALBUR <0.7 06/05/2015    Lab Results  Component Value Date   TSH 2.06 09/02/2017   Lab Results  Component Value Date   WBC 7.4 09/02/2017   HGB 15.3 (H) 09/02/2017   HCT 47.1 (H) 09/02/2017   MCV 89.7 09/02/2017   PLT 228.0 09/02/2017   Lab Results  Component Value Date   NA 138 09/02/2017   K 4.5 09/02/2017   CO2 28 09/02/2017   GLUCOSE 217 (H) 09/02/2017   BUN 16 09/02/2017   CREATININE 0.78 09/02/2017   BILITOT 0.6 09/02/2017   ALKPHOS 70 09/02/2017   AST 13 09/02/2017   ALT 20 09/02/2017   PROT 7.2 09/02/2017   ALBUMIN 4.5 09/02/2017   CALCIUM 9.8 09/02/2017   GFR 81.88 09/02/2017   Lab Results  Component Value Date   CHOL 222 (H) 09/02/2017   Lab Results  Component Value Date   HDL 57.70 09/02/2017   Lab Results  Component Value Date   LDLCALC 140 (H) 09/02/2017   Lab Results  Component Value Date   TRIG 119.0 09/02/2017   Lab Results  Component Value Date   CHOLHDL 4 09/02/2017   Lab Results  Component Value Date   HGBA1C 9.6 (H) 09/02/2017         Assessment & Plan:   Problem List Items Addressed This Visit    Hyperlipidemia    Encouraged heart healthy diet, increase exercise, avoid trans fats, consider a krill oil cap daily      Relevant Orders   Lipid panel (Completed)   Overweight    Encouraged DASH diet, decrease po intake and increase exercise as tolerated. Needs 7-8 hours  of sleep nightly. Avoid trans fats, eat small, frequent meals every 4-5 hours with lean proteins, complex carbs and healthy fats. Minimize simple carbs,      Preventative health care    Patient encouraged to maintain heart healthy diet, regular exercise, adequate sleep. Consider daily probiotics. Take medications as prescribed.       Relevant Orders   CBC (Completed)   TSH (Completed)   Diabetes mellitus (HCC)    hgba1c acceptable, minimize simple carbs. Increase exercise as tolerated. Continue current meds      Relevant Orders   Hemoglobin A1c (Completed)   Comprehensive metabolic panel (Completed)   Esophageal reflux    Avoid offending foods, start probiotics. Do not eat large meals in late evening and consider raising head of bed.        Other Visit Diagnoses    Colon cancer screening    -  Primary   Relevant Orders   Ambulatory referral to Gastroenterology      I have discontinued Jennifer Odom's metFORMIN. I am also having her maintain her multivitamin, fish oil-omega-3 fatty acids, Krill Oil, niacin, and glucose blood.  No orders of the defined types were placed in this encounter.   CMA served as Neurosurgeon during this visit. History, Physical and Plan performed by medical provider. Documentation and orders reviewed and attested to.  Danise Edge, MD

## 2017-09-02 NOTE — Assessment & Plan Note (Signed)
Patient encouraged to maintain heart healthy diet, regular exercise, adequate sleep. Consider daily probiotics. Take medications as prescribed 

## 2017-09-02 NOTE — Assessment & Plan Note (Signed)
Encouraged heart healthy diet, increase exercise, avoid trans fats, consider a krill oil cap daily 

## 2017-09-02 NOTE — Assessment & Plan Note (Signed)
Avoid offending foods, start probiotics. Do not eat large meals in late evening and consider raising head of bed.  

## 2017-09-02 NOTE — Patient Instructions (Addendum)
Shingrix is the new shingles shot, 2 shot over 2-6 months. Check with insurance confirm coverage then call for nurse appt to have administered  Tdap is ready to be boosted and I recommend the Pneumovax  Preventive Care 40-64 Years, Female Preventive care refers to lifestyle choices and visits with your health care provider that can promote health and wellness. What does preventive care include?  A yearly physical exam. This is also called an annual well check.  Dental exams once or twice a year.  Routine eye exams. Ask your health care provider how often you should have your eyes checked.  Personal lifestyle choices, including: ? Daily care of your teeth and gums. ? Regular physical activity. ? Eating a healthy diet. ? Avoiding tobacco and drug use. ? Limiting alcohol use. ? Practicing safe sex. ? Taking low-dose aspirin daily starting at age 65. ? Taking vitamin and mineral supplements as recommended by your health care provider. What happens during an annual well check? The services and screenings done by your health care provider during your annual well check will depend on your age, overall health, lifestyle risk factors, and family history of disease. Counseling Your health care provider may ask you questions about your:  Alcohol use.  Tobacco use.  Drug use.  Emotional well-being.  Home and relationship well-being.  Sexual activity.  Eating habits.  Work and work Statistician.  Method of birth control.  Menstrual cycle.  Pregnancy history.  Screening You may have the following tests or measurements:  Height, weight, and BMI.  Blood pressure.  Lipid and cholesterol levels. These may be checked every 5 years, or more frequently if you are over 83 years old.  Skin check.  Lung cancer screening. You may have this screening every year starting at age 53 if you have a 30-pack-year history of smoking and currently smoke or have quit within the past 15  years.  Fecal occult blood test (FOBT) of the stool. You may have this test every year starting at age 67.  Flexible sigmoidoscopy or colonoscopy. You may have a sigmoidoscopy every 5 years or a colonoscopy every 10 years starting at age 72.  Hepatitis C blood test.  Hepatitis B blood test.  Sexually transmitted disease (STD) testing.  Diabetes screening. This is done by checking your blood sugar (glucose) after you have not eaten for a while (fasting). You may have this done every 1-3 years.  Mammogram. This may be done every 1-2 years. Talk to your health care provider about when you should start having regular mammograms. This may depend on whether you have a family history of breast cancer.  BRCA-related cancer screening. This may be done if you have a family history of breast, ovarian, tubal, or peritoneal cancers.  Pelvic exam and Pap test. This may be done every 3 years starting at age 80. Starting at age 51, this may be done every 5 years if you have a Pap test in combination with an HPV test.  Bone density scan. This is done to screen for osteoporosis. You may have this scan if you are at high risk for osteoporosis.  Discuss your test results, treatment options, and if necessary, the need for more tests with your health care provider. Vaccines Your health care provider may recommend certain vaccines, such as:  Influenza vaccine. This is recommended every year.  Tetanus, diphtheria, and acellular pertussis (Tdap, Td) vaccine. You may need a Td booster every 10 years.  Varicella vaccine. You may need this  if you have not been vaccinated.  Zoster vaccine. You may need this after age 47.  Measles, mumps, and rubella (MMR) vaccine. You may need at least one dose of MMR if you were born in 1957 or later. You may also need a second dose.  Pneumococcal 13-valent conjugate (PCV13) vaccine. You may need this if you have certain conditions and were not previously  vaccinated.  Pneumococcal polysaccharide (PPSV23) vaccine. You may need one or two doses if you smoke cigarettes or if you have certain conditions.  Meningococcal vaccine. You may need this if you have certain conditions.  Hepatitis A vaccine. You may need this if you have certain conditions or if you travel or work in places where you may be exposed to hepatitis A.  Hepatitis B vaccine. You may need this if you have certain conditions or if you travel or work in places where you may be exposed to hepatitis B.  Haemophilus influenzae type b (Hib) vaccine. You may need this if you have certain conditions.  Talk to your health care provider about which screenings and vaccines you need and how often you need them. This information is not intended to replace advice given to you by your health care provider. Make sure you discuss any questions you have with your health care provider. Document Released: 09/01/2015 Document Revised: 04/24/2016 Document Reviewed: 06/06/2015 Elsevier Interactive Patient Education  [employer].

## 2017-09-02 NOTE — Assessment & Plan Note (Signed)
hgba1c acceptable, minimize simple carbs. Increase exercise as tolerated. Continue current meds 

## 2017-09-02 NOTE — Assessment & Plan Note (Signed)
Encouraged DASH diet, decrease po intake and increase exercise as tolerated. Needs 7-8 hours of sleep nightly. Avoid trans fats, eat small, frequent meals every 4-5 hours with lean proteins, complex carbs and healthy fats. Minimize simple carbs, 

## 2017-09-03 ENCOUNTER — Encounter: Payer: Self-pay | Admitting: Family Medicine

## 2017-09-04 ENCOUNTER — Other Ambulatory Visit: Payer: Self-pay

## 2017-09-04 DIAGNOSIS — E119 Type 2 diabetes mellitus without complications: Secondary | ICD-10-CM

## 2017-09-04 MED ORDER — METFORMIN HCL ER 500 MG PO TB24: 500.0000 mg | ORAL_TABLET | Freq: Every day | ORAL | 1 refills | Status: DC

## 2017-09-04 MED ORDER — METFORMIN HCL 500 MG PO TABS: 500.0000 mg | ORAL_TABLET | Freq: Two times a day (BID) | ORAL | 2 refills | Status: DC

## 2017-09-04 NOTE — Telephone Encounter (Signed)
Spoke with patient  meds sent in

## 2017-10-03 ENCOUNTER — Encounter: Payer: Self-pay | Admitting: Family Medicine

## 2017-12-26 ENCOUNTER — Ambulatory Visit: Payer: Managed Care, Other (non HMO) | Admitting: Family Medicine

## 2018-01-22 ENCOUNTER — Ambulatory Visit: Payer: Managed Care, Other (non HMO) | Admitting: Family Medicine

## 2018-01-22 ENCOUNTER — Encounter: Payer: Self-pay | Admitting: Family Medicine

## 2018-01-22 VITALS — BP 122/76 | HR 96 | Temp 98.2°F | Resp 18 | Wt 176.2 lb

## 2018-01-22 DIAGNOSIS — K219 Gastro-esophageal reflux disease without esophagitis: Secondary | ICD-10-CM | POA: Diagnosis not present

## 2018-01-22 DIAGNOSIS — F4541 Pain disorder exclusively related to psychological factors: Secondary | ICD-10-CM | POA: Diagnosis not present

## 2018-01-22 DIAGNOSIS — E119 Type 2 diabetes mellitus without complications: Secondary | ICD-10-CM | POA: Diagnosis not present

## 2018-01-22 DIAGNOSIS — E785 Hyperlipidemia, unspecified: Secondary | ICD-10-CM

## 2018-01-22 DIAGNOSIS — E663 Overweight: Secondary | ICD-10-CM

## 2018-01-22 DIAGNOSIS — Z1211 Encounter for screening for malignant neoplasm of colon: Secondary | ICD-10-CM

## 2018-01-22 LAB — LIPID PANEL
CHOLESTEROL: 233 mg/dL — AB (ref 0–200)
HDL: 51.6 mg/dL (ref >39.00)
LDL Cholesterol: 158 mg/dL — ABNORMAL HIGH (ref 0–99)
NonHDL: 181.02
Total CHOL/HDL Ratio: 5
Triglycerides: 114 mg/dL (ref 0.0–149.0)
VLDL: 22.8 mg/dL (ref 0.0–40.0)

## 2018-01-22 LAB — COMPREHENSIVE METABOLIC PANEL
ALBUMIN: 4.4 g/dL (ref 3.5–5.2)
ALT: 21 U/L (ref 0–35)
AST: 14 U/L (ref 0–37)
Alkaline Phosphatase: 68 U/L (ref 39–117)
BUN: 14 mg/dL (ref 6–23)
CALCIUM: 9.7 mg/dL (ref 8.4–10.5)
CHLORIDE: 103 meq/L (ref 96–112)
CO2: 28 mEq/L (ref 19–32)
Creatinine, Ser: 0.68 mg/dL (ref 0.40–1.20)
GFR: 95.79 mL/min (ref >60.00)
Glucose, Bld: 194 mg/dL — ABNORMAL HIGH (ref 70–99)
POTASSIUM: 4.7 meq/L (ref 3.5–5.1)
Sodium: 138 mEq/L (ref 135–145)
Total Bilirubin: 0.5 mg/dL (ref 0.2–1.2)
Total Protein: 6.5 g/dL (ref 6.0–8.3)

## 2018-01-22 LAB — CBC
HEMATOCRIT: 42.9 % (ref 36.0–46.0)
HEMOGLOBIN: 14.6 g/dL (ref 12.0–15.0)
MCHC: 34 g/dL (ref 30.0–36.0)
MCV: 87.1 fl (ref 78.0–100.0)
PLATELETS: 266 10*3/uL (ref 150.0–400.0)
RBC: 4.92 Mil/uL (ref 3.87–5.11)
RDW: 13.7 % (ref 11.5–15.5)
WBC: 7.5 10*3/uL (ref 4.0–10.5)

## 2018-01-22 LAB — TSH: TSH: 1.91 u[IU]/mL (ref 0.35–4.50)

## 2018-01-22 LAB — HEMOGLOBIN A1C: Hgb A1c MFr Bld: 8.3 % — ABNORMAL HIGH (ref 4.6–6.5)

## 2018-01-22 MED ORDER — METFORMIN HCL ER 500 MG PO TB24: 500.0000 mg | ORAL_TABLET | Freq: Two times a day (BID) | ORAL | 1 refills | Status: DC

## 2018-01-22 NOTE — Assessment & Plan Note (Signed)
Encouraged DASH diet, decrease po intake and increase exercise as tolerated. Needs 7-8 hours of sleep nightly. Avoid trans fats, eat small, frequent meals every 4-5 hours with lean proteins, complex carbs and healthy fats. Minimize simple carbs 

## 2018-01-22 NOTE — Patient Instructions (Signed)

## 2018-01-22 NOTE — Assessment & Plan Note (Signed)
Encouraged heart healthy diet, increase exercise, avoid trans fats, consider a krill oil cap daily 

## 2018-01-22 NOTE — Assessment & Plan Note (Signed)
Only symptomatic weekly after eating too close to bed and eating the wrong things.

## 2018-01-22 NOTE — Progress Notes (Signed)
Subjective:  I acted as a Neurosurgeon for Dr. Abner Greenspan. [place], [place]  Patient ID: Jennifer Odom, female    DOB: 01/02/1966, 52 y.o.   MRN: [id number]  No chief complaint on file.   HPI  Patient is in today for a 4 month follow up. She is following up on her hyperlipidemia and other medical concerns. She has no acute concerns. No recent febrile illness or acute hospitalizations. Denies CP/palp/SOB/HA/congestion/fevers/GI or GU c/o. Taking meds as prescribed. Her dad just died and she is sleeping poorly as she ran back and forth to [place]. Poor diet and poor hydration. She is having reflux maybe once a week with eating too close to bed or eating badly. tums takes care of it. Notes myalgias in her upper arms since starting Metformin.    Patient Care Team: Bradd Canary, MD as PCP - General (Family Medicine) Bernette Redbird, MD as Consulting Physician (Gastroenterology)   Past Medical History:  Diagnosis Date  . Chicken pox as a child  . CTS (carpal tunnel syndrome) 01/23/2012  . Diabetes mellitus 1994   gestational   . Diabetes mellitus   . Elevated BP   . Esophageal reflux 05/18/2014  . Fatigue 09/25/2011  . GDM (gestational diabetes mellitus)   . Hyperglycemia   . Hyperlipidemia   . Mumps as a child  . Overweight(278.02)   . Preventative health care 09/25/2011    Past Surgical History:  Procedure Laterality Date  . APPENDECTOMY     high school  . ECTOPIC PREGNANCY SURGERY  2004   left side?    Family History  Problem Relation Age of Onset  . Cancer Mother        colon  . Diabetes Father        type 2  . Heart disease Father        open heart surgery,triple bypass  . Leukemia Father   . Cancer Father        CLL  . Stroke Father   . Stroke Maternal Grandmother        X several  . Hypertension Maternal Grandmother   . Emphysema Maternal Grandfather        smoker  . Heart attack Paternal Grandfather   . Heart disease Paternal Grandfather        MI, sudden death  .  Endometriosis Sister   . Cancer Maternal Uncle     Social History   Socioeconomic History  . Marital status: Divorced    Spouse name: Not on file  . Number of children: Not on file  . Years of education: Not on file  . Highest education level: Not on file  Occupational History  . Not on file  Social Needs  . Financial resource strain: Not on file  . Food insecurity:    Worry: Not on file    Inability: Not on file  . Transportation needs:    Medical: Not on file    Non-medical: Not on file  Tobacco Use  . Smoking status: Never Smoker  . Smokeless tobacco: Never Used  Substance and Sexual Activity  . Alcohol use: Yes    Comment: maybe once a month  . Drug use: No  . Sexual activity: Never  Lifestyle  . Physical activity:    Days per week: Not on file    Minutes per session: Not on file  . Stress: Not on file  Relationships  . Social connections:    Talks on phone: Not on file  Gets together: Not on file    Attends religious service: Not on file    Active member of club or organization: Not on file    Attends meetings of clubs or organizations: Not on file    Relationship status: Not on file  . Intimate partner violence:    Fear of current or ex partner: Not on file    Emotionally abused: Not on file    Physically abused: Not on file    Forced sexual activity: Not on file  Other Topics Concern  . Not on file  Social History Narrative  . Not on file    Outpatient Medications Prior to Visit  Medication Sig Dispense Refill  . fish oil-omega-3 fatty acids 1000 MG capsule Take 2 g by mouth daily.    Marland Kitchen. glucose blood (FREESTYLE LITE) test strip 1 each by Other route as needed for other. 100 each 4  . Krill Oil CAPS Take 1 capsule by mouth daily. 30 capsule 0  . Multiple Vitamin (MULTIVITAMIN) tablet Take 1 tablet by mouth daily.    . niacin 500 MG CR capsule 1 cap po qhs x 1 week and if tolerated increase to 2 tabs po at bedtime. Take a lowfat snack and an 81 mg  aspirin 1/2 hour prior 30 capsule 0  . metFORMIN (GLUCOPHAGE-XR) 500 MG 24 hr tablet Take 1 tablet (500 mg total) by mouth daily with breakfast. 30 tablet 1   No facility-administered medications prior to visit.     Allergies  Allergen Reactions  . Sulfa Antibiotics Hives    Review of Systems  Constitutional: Positive for malaise/fatigue. Negative for fever.  HENT: Negative for congestion.   Eyes: Negative for blurred vision.  Respiratory: Negative for shortness of breath.   Cardiovascular: Negative for chest pain, palpitations and leg swelling.  Gastrointestinal: Positive for heartburn. Negative for abdominal pain, blood in stool and nausea.  Genitourinary: Negative for dysuria and frequency.  Musculoskeletal: Positive for myalgias. Negative for falls.  Skin: Negative for rash.  Neurological: Negative for dizziness, loss of consciousness and headaches.  Endo/Heme/Allergies: Negative for environmental allergies.  Psychiatric/Behavioral: Negative for depression. The patient is not nervous/anxious.        Objective:    Physical Exam  Constitutional: She is oriented to person, place, and time. She appears well-developed and well-nourished. No distress.  HENT:  Head: Normocephalic and atraumatic.  Nose: Nose normal.  Eyes: Right eye exhibits no discharge. Left eye exhibits no discharge.  Neck: Normal range of motion. Neck supple.  Cardiovascular: Normal rate and regular rhythm.  No murmur heard. Pulmonary/Chest: Effort normal and breath sounds normal.  Abdominal: Soft. Bowel sounds are normal. There is no tenderness.  Musculoskeletal: She exhibits no edema.  Neurological: She is alert and oriented to person, place, and time.  Skin: Skin is warm and dry.  Psychiatric: She has a normal mood and affect.  Nursing note and vitals reviewed.   BP 122/76 (BP Location: Left Arm, Patient Position: Sitting, Cuff Size: Normal)   Pulse 96   Temp 98.2 F (36.8 C) (Oral)   Resp 18    Wt 176 lb 3.2 oz (79.9 kg)   SpO2 98%   BMI 27.60 kg/m  Wt Readings from Last 3 Encounters:  01/22/18 176 lb 3.2 oz (79.9 kg)  09/02/17 176 lb 3.2 oz (79.9 kg)  06/05/15 189 lb 9 oz (86 kg)   BP Readings from Last 3 Encounters:  01/22/18 122/76  09/02/17 120/80  06/05/15 120/80  Immunization History  Administered Date(s) Administered  . Pneumococcal Conjugate-13 06/05/2015  . Tdap 08/19/2005    Health Maintenance  Topic Date Due  . Hepatitis C Screening  08-May-1966  . PNEUMOCOCCAL POLYSACCHARIDE VACCINE (1) 12/05/1965  . FOOT EXAM  12/05/1973  . OPHTHALMOLOGY EXAM  12/05/1973  . HIV Screening  12/06/1978  . PAP SMEAR  08/20/2011  . MAMMOGRAM  12/05/2013  . COLONOSCOPY  12/05/2013  . TETANUS/TDAP  08/20/2015  . URINE MICROALBUMIN  06/04/2016  . INFLUENZA VACCINE  05/05/2018 (Originally 03/19/2018)  . HEMOGLOBIN A1C  03/02/2018    Lab Results  Component Value Date   WBC 7.4 09/02/2017   HGB 15.3 (H) 09/02/2017   HCT 47.1 (H) 09/02/2017   PLT 228.0 09/02/2017   GLUCOSE 217 (H) 09/02/2017   CHOL 222 (H) 09/02/2017   TRIG 119.0 09/02/2017   HDL 57.70 09/02/2017   LDLDIRECT 163.9 01/23/2012   LDLCALC 140 (H) 09/02/2017   ALT 20 09/02/2017   AST 13 09/02/2017   NA 138 09/02/2017   K 4.5 09/02/2017   CL 104 09/02/2017   CREATININE 0.78 09/02/2017   BUN 16 09/02/2017   CO2 28 09/02/2017   TSH 2.06 09/02/2017   HGBA1C 9.6 (H) 09/02/2017   MICROALBUR <0.7 06/05/2015    Lab Results  Component Value Date   TSH 2.06 09/02/2017   Lab Results  Component Value Date   WBC 7.4 09/02/2017   HGB 15.3 (H) 09/02/2017   HCT 47.1 (H) 09/02/2017   MCV 89.7 09/02/2017   PLT 228.0 09/02/2017   Lab Results  Component Value Date   NA 138 09/02/2017   K 4.5 09/02/2017   CO2 28 09/02/2017   GLUCOSE 217 (H) 09/02/2017   BUN 16 09/02/2017   CREATININE 0.78 09/02/2017   BILITOT 0.6 09/02/2017   ALKPHOS 70 09/02/2017   AST 13 09/02/2017   ALT 20 09/02/2017   PROT 7.2  09/02/2017   ALBUMIN 4.5 09/02/2017   CALCIUM 9.8 09/02/2017   GFR 81.88 09/02/2017   Lab Results  Component Value Date   CHOL 222 (H) 09/02/2017   Lab Results  Component Value Date   HDL 57.70 09/02/2017   Lab Results  Component Value Date   LDLCALC 140 (H) 09/02/2017   Lab Results  Component Value Date   TRIG 119.0 09/02/2017   Lab Results  Component Value Date   CHOLHDL 4 09/02/2017   Lab Results  Component Value Date   HGBA1C 9.6 (H) 09/02/2017         Assessment & Plan:   Problem List Items Addressed This Visit    Hyperlipidemia    Encouraged heart healthy diet, increase exercise, avoid trans fats, consider a krill oil cap daily      Relevant Orders   Lipid panel   Overweight    Encouraged DASH diet, decrease po intake and increase exercise as tolerated. Needs 7-8 hours of sleep nightly. Avoid trans fats, eat small, frequent meals every 4-5 hours with lean proteins, complex carbs and healthy fats. Minimize simple carbs      Colon cancer screening - Primary    Patient is referred to Dr Ronney Asters at her request for screening colonoscopy      Relevant Orders   Ambulatory referral to Gastroenterology   Diabetes mellitus (HCC)    hgba1c unacceptable, minimize simple carbs. Increase exercise as tolerated. She reports her arms ache with the metformin which happened to her in the past as well but she does not want  to stop the medications. Discussed options if her sugars are still up since she has been eating and sleeping badly secondary to the stress at work and with her father's recent death in 12/02/22      Relevant Medications   metFORMIN (GLUCOPHAGE-XR) 500 MG 24 hr tablet   Other Relevant Orders   Hemoglobin A1c   Comprehensive metabolic panel   TSH   Esophageal reflux    Only symptomatic weekly after eating too close to bed and eating the wrong things.       Relevant Orders   CBC   TSH   Stress headaches    Her dad just died and she is sleeping  poorly as she ran back and forth to [place]. Poor diet and poor hydration. Encouraged increased hydration, 64 ounces of clear fluids daily. Minimize alcohol and caffeine. Eat small frequent meals with lean proteins and complex carbs. Avoid high and low blood sugars. Get adequate sleep, 7-8 hours a night. Needs exercise daily preferably in the morning.      Relevant Orders   TSH      I have changed Imogine Achenbach's metFORMIN. I am also having her maintain her multivitamin, fish oil-omega-3 fatty acids, Krill Oil, niacin, and glucose blood.  Meds ordered this encounter  Medications  . metFORMIN (GLUCOPHAGE-XR) 500 MG 24 hr tablet    Sig: Take 1 tablet (500 mg total) by mouth 2 (two) times daily with a meal.    Dispense:  180 tablet    Refill:  1    CMA served as scribe during this visit. History, Physical and Plan performed by medical provider. Documentation and orders reviewed and attested to.  Danise Edge, MD

## 2018-01-22 NOTE — Assessment & Plan Note (Signed)
Her dad just died and she is sleeping poorly as she ran back and forth to [place]. Poor diet and poor hydration. Encouraged increased hydration, 64 ounces of clear fluids daily. Minimize alcohol and caffeine. Eat small frequent meals with lean proteins and complex carbs. Avoid high and low blood sugars. Get adequate sleep, 7-8 hours a night. Needs exercise daily preferably in the morning.

## 2018-01-22 NOTE — Assessment & Plan Note (Signed)
Patient is referred to Dr Ronney AstersBucchini at her request for screening colonoscopy

## 2018-01-22 NOTE — Assessment & Plan Note (Addendum)
hgba1c unacceptable, minimize simple carbs. Increase exercise as tolerated. She reports her arms ache with the metformin which happened to her in the past as well but she does not want to stop the medications. Discussed options if her sugars are still up since she has been eating and sleeping badly secondary to the stress at work and with her father's recent death in [place]

## 2018-05-25 ENCOUNTER — Ambulatory Visit: Payer: Managed Care, Other (non HMO) | Admitting: Family Medicine

## 2018-06-25 ENCOUNTER — Ambulatory Visit: Payer: Self-pay | Admitting: Family Medicine

## 2018-06-30 ENCOUNTER — Ambulatory Visit: Payer: Self-pay | Admitting: Family Medicine

## 2018-06-30 DIAGNOSIS — Z0289 Encounter for other administrative examinations: Secondary | ICD-10-CM

## 2018-07-01 ENCOUNTER — Encounter: Payer: Self-pay | Admitting: Family Medicine

## 2018-09-24 ENCOUNTER — Ambulatory Visit: Payer: 59 | Admitting: Family Medicine

## 2018-09-24 ENCOUNTER — Encounter: Payer: Self-pay | Admitting: Family Medicine

## 2018-09-24 VITALS — BP 118/78 | HR 73 | Temp 97.6°F | Resp 18 | Wt 174.6 lb

## 2018-09-24 DIAGNOSIS — E663 Overweight: Secondary | ICD-10-CM

## 2018-09-24 DIAGNOSIS — E785 Hyperlipidemia, unspecified: Secondary | ICD-10-CM | POA: Diagnosis not present

## 2018-09-24 DIAGNOSIS — R102 Pelvic and perineal pain: Secondary | ICD-10-CM

## 2018-09-24 DIAGNOSIS — R1031 Right lower quadrant pain: Secondary | ICD-10-CM | POA: Diagnosis not present

## 2018-09-24 DIAGNOSIS — E119 Type 2 diabetes mellitus without complications: Secondary | ICD-10-CM | POA: Diagnosis not present

## 2018-09-24 LAB — URINALYSIS
Bilirubin Urine: NEGATIVE
Hgb urine dipstick: NEGATIVE
Ketones, ur: NEGATIVE
Leukocytes, UA: NEGATIVE
NITRITE: NEGATIVE
Specific Gravity, Urine: 1.02 (ref 1.000–1.030)
Total Protein, Urine: NEGATIVE
Urine Glucose: NEGATIVE
Urobilinogen, UA: 0.2 (ref 0.0–1.0)
pH: 5.5 (ref 5.0–8.0)

## 2018-09-24 LAB — COMPREHENSIVE METABOLIC PANEL
ALT: 22 U/L (ref 0–35)
AST: 15 U/L (ref 0–37)
Albumin: 4.6 g/dL (ref 3.5–5.2)
Alkaline Phosphatase: 69 U/L (ref 39–117)
BUN: 13 mg/dL (ref 6–23)
CO2: 30 mEq/L (ref 19–32)
Calcium: 9.8 mg/dL (ref 8.4–10.5)
Chloride: 105 mEq/L (ref 96–112)
Creatinine, Ser: 0.78 mg/dL (ref 0.40–1.20)
GFR: 76.73 mL/min (ref >60.00)
Glucose, Bld: 154 mg/dL — ABNORMAL HIGH (ref 70–99)
Potassium: 4.8 mEq/L (ref 3.5–5.1)
Sodium: 141 mEq/L (ref 135–145)
Total Bilirubin: 0.5 mg/dL (ref 0.2–1.2)
Total Protein: 6.8 g/dL (ref 6.0–8.3)

## 2018-09-24 LAB — CBC
HCT: 45.4 % (ref 36.0–46.0)
Hemoglobin: 15.2 g/dL — ABNORMAL HIGH (ref 12.0–15.0)
MCHC: 33.4 g/dL (ref 30.0–36.0)
MCV: 87.6 fl (ref 78.0–100.0)
Platelets: 254 10*3/uL (ref 150.0–400.0)
RBC: 5.18 Mil/uL — ABNORMAL HIGH (ref 3.87–5.11)
RDW: 14 % (ref 11.5–15.5)
WBC: 7.1 10*3/uL (ref 4.0–10.5)

## 2018-09-24 LAB — LIPID PANEL
CHOL/HDL RATIO: 4
Cholesterol: 218 mg/dL — ABNORMAL HIGH (ref 0–200)
HDL: 51.7 mg/dL (ref >39.00)
LDL Cholesterol: 142 mg/dL — ABNORMAL HIGH (ref 0–99)
NONHDL: 166.03
Triglycerides: 121 mg/dL (ref 0.0–149.0)
VLDL: 24.2 mg/dL (ref 0.0–40.0)

## 2018-09-24 LAB — HEMOGLOBIN A1C: Hgb A1c MFr Bld: 8.3 % — ABNORMAL HIGH (ref 4.6–6.5)

## 2018-09-24 NOTE — Patient Instructions (Signed)
Carbohydrate Counting for Diabetes Mellitus, Adult  Carbohydrate counting is a method of keeping track of how many carbohydrates you eat. Eating carbohydrates naturally increases the amount of sugar (glucose) in the blood. Counting how many carbohydrates you eat helps keep your blood glucose within normal limits, which helps you manage your diabetes (diabetes mellitus). It is important to know how many carbohydrates you can safely have in each meal. This is different for every person. A diet and nutrition specialist (registered dietitian) can help you make a meal plan and calculate how many carbohydrates you should have at each meal and snack. Carbohydrates are found in the following foods:  Grains, such as breads and cereals.  Dried beans and soy products.  Starchy vegetables, such as potatoes, peas, and corn.  Fruit and fruit juices.  Milk and yogurt.  Sweets and snack foods, such as cake, cookies, candy, chips, and soft drinks. How do I count carbohydrates? There are two ways to count carbohydrates in food. You can use either of the methods or a combination of both. Reading "Nutrition Facts" on packaged food The "Nutrition Facts" list is included on the labels of almost all packaged foods and beverages in the [place] It includes:  The serving size.  Information about nutrients in each serving, including the grams (g) of carbohydrate per serving. To use the "Nutrition Facts":  Decide how many servings you will have.  Multiply the number of servings by the number of carbohydrates per serving.  The resulting number is the total amount of carbohydrates that you will be having. Learning standard serving sizes of other foods When you eat carbohydrate foods that are not packaged or do not include "Nutrition Facts" on the label, you need to measure the servings in order to count the amount of carbohydrates:  Measure the foods that you will eat with a food scale or measuring cup, if needed.   Decide how many standard-size servings you will eat.  Multiply the number of servings by 15. Most carbohydrate-rich foods have about 15 g of carbohydrates per serving. ? For example, if you eat 8 oz (170 g) of strawberries, you will have eaten 2 servings and 30 g of carbohydrates (2 servings x 15 g = 30 g).  For foods that have more than one food mixed, such as soups and casseroles, you must count the carbohydrates in each food that is included. The following list contains standard serving sizes of common carbohydrate-rich foods. Each of these servings has about 15 g of carbohydrates:   hamburger bun or  English muffin.   oz (15 mL) syrup.   oz (14 g) jelly.  1 slice of bread.  1 six-inch tortilla.  3 oz (85 g) cooked rice or pasta.  4 oz (113 g) cooked dried beans.  4 oz (113 g) starchy vegetable, such as peas, corn, or potatoes.  4 oz (113 g) hot cereal.  4 oz (113 g) mashed potatoes or  of a large baked potato.  4 oz (113 g) canned or frozen fruit.  4 oz (120 mL) fruit juice.  4-6 crackers.  6 chicken nuggets.  6 oz (170 g) unsweetened dry cereal.  6 oz (170 g) plain fat-free yogurt or yogurt sweetened with artificial sweeteners.  8 oz (240 mL) milk.  8 oz (170 g) fresh fruit or one small piece of fruit.  24 oz (680 g) popped popcorn. Example of carbohydrate counting Sample meal  3 oz (85 g) chicken breast.  6 oz (170 g)   brown rice.  4 oz (113 g) corn.  8 oz (240 mL) milk.  8 oz (170 g) strawberries with sugar-free whipped topping. Carbohydrate calculation 1. Identify the foods that contain carbohydrates: ? Rice. ? Corn. ? Milk. ? Strawberries. 2. Calculate how many servings you have of each food: ? 2 servings rice. ? 1 serving corn. ? 1 serving milk. ? 1 serving strawberries. 3. Multiply each number of servings by 15 g: ? 2 servings rice x 15 g = 30 g. ? 1 serving corn x 15 g = 15 g. ? 1 serving milk x 15 g = 15 g. ? 1 serving  strawberries x 15 g = 15 g. 4. Add together all of the amounts to find the total grams of carbohydrates eaten: ? 30 g + 15 g + 15 g + 15 g = 75 g of carbohydrates total. Summary  Carbohydrate counting is a method of keeping track of how many carbohydrates you eat.  Eating carbohydrates naturally increases the amount of sugar (glucose) in the blood.  Counting how many carbohydrates you eat helps keep your blood glucose within normal limits, which helps you manage your diabetes.  A diet and nutrition specialist (registered dietitian) can help you make a meal plan and calculate how many carbohydrates you should have at each meal and snack. This information is not intended to replace advice given to you by your health care provider. Make sure you discuss any questions you have with your health care provider. Document Released: 08/05/2005 Document Revised: 02/12/2017 Document Reviewed: 01/17/2016 Elsevier Interactive Patient Education  2019 [employer]  

## 2018-09-25 ENCOUNTER — Telehealth: Payer: Self-pay | Admitting: Family Medicine

## 2018-09-25 LAB — URINE CULTURE
MICRO NUMBER:: 161276
Result:: NO GROWTH
SPECIMEN QUALITY:: ADEQUATE

## 2018-09-25 MED ORDER — METFORMIN HCL ER 500 MG PO TB24: 1000.0000 mg | ORAL_TABLET | Freq: Two times a day (BID) | ORAL | 1 refills | Status: DC

## 2018-09-25 NOTE — Telephone Encounter (Signed)
Notified pt of below. She is agreeable to increase metformin and would like rx to go to [facility] [address]. She declines Atorvastatin at this time. States she has been under increased stress over the last 8 months after losing her dad. Reports that she is going to try diet first as she doesn't want to take a statin. Metformin sent to pharmacy.

## 2018-09-25 NOTE — Telephone Encounter (Signed)
Copied from CRM [phone]. Topic: General - Other >> Sep 25, 2018  8:06 AM Gerrianne ScalePayne, Earnie Bechard L wrote: Reason for CRM: pt calling about her lab results please call pt today she only has 5 pills left on the metformin

## 2018-09-25 NOTE — Telephone Encounter (Signed)
Notes recorded by Bradd Canary, MD on 09/24/2018 at 2:15 PM EST Notify her sugar still up. Should increase Metformin to 500 mg tabs, 2 tabs po bid disp#360 with 1 rf and should also start Atorvastatin 10 mg po qhs, disp #90 with 1 rf and recheck labs in 3 months

## 2018-09-27 DIAGNOSIS — R102 Pelvic and perineal pain: Secondary | ICD-10-CM | POA: Insufficient documentation

## 2018-09-27 NOTE — Assessment & Plan Note (Signed)
Encouraged heart healthy diet, increase exercise, avoid trans fats, consider a krill oil cap daily 

## 2018-09-27 NOTE — Assessment & Plan Note (Signed)
Encouraged DASH diet, decrease po intake and increase exercise as tolerated. Needs 7-8 hours of sleep nightly. Avoid trans fats, eat small, frequent meals every 4-5 hours with lean proteins, complex carbs and healthy fats. Minimize simple carbs 

## 2018-09-27 NOTE — Assessment & Plan Note (Signed)
hgba1c unacceptable, mildly elevated but stable, minimize simple carbs. Increase exercise as tolerated. Continue current meds

## 2018-09-27 NOTE — Progress Notes (Signed)
Subjective:    Patient ID: Jennifer Odom, female    DOB: [birth year], 53 y.o.   MRN: [id number]  No chief complaint on file.   HPI Patient is in today for follow-up.  Overall she is feeling well.  She reports her sugars have been stable.  No polyuria or polydipsia.  No change in her bowel habits but she is noting an increase in right lower quadrant pain.  She describes these painful episodes more as twinges and they are fleeting.  There is no association with using the bathroom.  She does also notes several months now of intermittent pelvic pain and is interested in a referral to gynecology for further evaluation.  No urinary symptoms are noted.  No change in appetite or nausea vomiting. Denies CP/palp/SOB/HA/congestion/fevers/GI or GU c/o. Taking meds as prescribed  Past Medical History:  Diagnosis Date  . Chicken pox as a child  . CTS (carpal tunnel syndrome) 01/23/2012  . Diabetes mellitus 1994   gestational   . Diabetes mellitus   . Elevated BP   . Esophageal reflux 05/18/2014  . Fatigue 09/25/2011  . GDM (gestational diabetes mellitus)   . Hyperglycemia   . Hyperlipidemia   . Mumps as a child  . Overweight(278.02)   . Preventative health care 09/25/2011    Past Surgical History:  Procedure Laterality Date  . APPENDECTOMY     high school  . ECTOPIC PREGNANCY SURGERY  2004   left side?    Family History  Problem Relation Age of Onset  . Cancer Mother        colon  . Diabetes Father        type 2  . Heart disease Father        open heart surgery,triple bypass  . Leukemia Father   . Cancer Father        CLL  . Stroke Father   . Stroke Maternal Grandmother        X several  . Hypertension Maternal Grandmother   . Emphysema Maternal Grandfather        smoker  . Heart attack Paternal Grandfather   . Heart disease Paternal Grandfather        MI, sudden death  . Endometriosis Sister   . Cancer Maternal Uncle     Social History   Socioeconomic History  .  Marital status: Divorced    Spouse name: Not on file  . Number of children: Not on file  . Years of education: Not on file  . Highest education level: Not on file  Occupational History  . Not on file  Social Needs  . Financial resource strain: Not on file  . Food insecurity:    Worry: Not on file    Inability: Not on file  . Transportation needs:    Medical: Not on file    Non-medical: Not on file  Tobacco Use  . Smoking status: Never Smoker  . Smokeless tobacco: Never Used  Substance and Sexual Activity  . Alcohol use: Yes    Comment: maybe once a month  . Drug use: No  . Sexual activity: Never  Lifestyle  . Physical activity:    Days per week: Not on file    Minutes per session: Not on file  . Stress: Not on file  Relationships  . Social connections:    Talks on phone: Not on file    Gets together: Not on file    Attends religious service: Not on file  Active member of club or organization: Not on file    Attends meetings of clubs or organizations: Not on file    Relationship status: Not on file  . Intimate partner violence:    Fear of current or ex partner: Not on file    Emotionally abused: Not on file    Physically abused: Not on file    Forced sexual activity: Not on file  Other Topics Concern  . Not on file  Social History Narrative  . Not on file    Outpatient Medications Prior to Visit  Medication Sig Dispense Refill  . fish oil-omega-3 fatty acids 1000 MG capsule Take 2 g by mouth daily.    Marland Kitchen. glucose blood (FREESTYLE LITE) test strip 1 each by Other route as needed for other. 100 each 4  . Krill Oil CAPS Take 1 capsule by mouth daily. 30 capsule 0  . Multiple Vitamin (MULTIVITAMIN) tablet Take 1 tablet by mouth daily.    . niacin 500 MG CR capsule 1 cap po qhs x 1 week and if tolerated increase to 2 tabs po at bedtime. Take a lowfat snack and an 81 mg aspirin 1/2 hour prior 30 capsule 0  . metFORMIN (GLUCOPHAGE-XR) 500 MG 24 hr tablet Take 1 tablet  (500 mg total) by mouth 2 (two) times daily with a meal. 180 tablet 1   No facility-administered medications prior to visit.     Allergies  Allergen Reactions  . Sulfa Antibiotics Hives    Review of Systems  Constitutional: Negative for fever and malaise/fatigue.  HENT: Negative for congestion.   Eyes: Negative for blurred vision.  Respiratory: Negative for shortness of breath.   Cardiovascular: Negative for chest pain, palpitations and leg swelling.  Gastrointestinal: Positive for abdominal pain. Negative for blood in stool, constipation, diarrhea, melena and nausea.  Genitourinary: Negative for dysuria and frequency.  Musculoskeletal: Negative for falls.  Skin: Negative for rash.  Neurological: Negative for dizziness, loss of consciousness and headaches.  Endo/Heme/Allergies: Negative for environmental allergies.  Psychiatric/Behavioral: Negative for depression. The patient is not nervous/anxious.        Objective:    Physical Exam Vitals signs and nursing note reviewed.  Constitutional:      General: She is not in acute distress.    Appearance: She is well-developed.  HENT:     Head: Normocephalic and atraumatic.     Nose: Nose normal.  Eyes:     General:        Right eye: No discharge.        Left eye: No discharge.  Neck:     Musculoskeletal: Normal range of motion and neck supple.  Cardiovascular:     Rate and Rhythm: Normal rate and regular rhythm.     Heart sounds: No murmur.  Pulmonary:     Effort: Pulmonary effort is normal.     Breath sounds: Normal breath sounds.  Abdominal:     General: Bowel sounds are normal.     Palpations: Abdomen is soft.     Tenderness: There is no abdominal tenderness.  Skin:    General: Skin is warm and dry.  Neurological:     Mental Status: She is alert and oriented to person, place, and time.     BP 118/78 (BP Location: Left Arm, Patient Position: Sitting, Cuff Size: Normal)   Pulse 73   Temp 97.6 F (36.4 C) (Oral)    Resp 18   Wt 174 lb 9.6 oz (79.2 kg)  SpO2 98%   BMI 27.35 kg/m  Wt Readings from Last 3 Encounters:  09/24/18 174 lb 9.6 oz (79.2 kg)  01/22/18 176 lb 3.2 oz (79.9 kg)  09/02/17 176 lb 3.2 oz (79.9 kg)     Lab Results  Component Value Date   WBC 7.1 09/24/2018   HGB 15.2 (H) 09/24/2018   HCT 45.4 09/24/2018   PLT 254.0 09/24/2018   GLUCOSE 154 (H) 09/24/2018   CHOL 218 (H) 09/24/2018   TRIG 121.0 09/24/2018   HDL 51.70 09/24/2018   LDLDIRECT 163.9 01/23/2012   LDLCALC 142 (H) 09/24/2018   ALT 22 09/24/2018   AST 15 09/24/2018   NA 141 09/24/2018   K 4.8 09/24/2018   CL 105 09/24/2018   CREATININE 0.78 09/24/2018   BUN 13 09/24/2018   CO2 30 09/24/2018   TSH 1.91 01/22/2018   HGBA1C 8.3 (H) 09/24/2018   MICROALBUR <0.7 06/05/2015    Lab Results  Component Value Date   TSH 1.91 01/22/2018   Lab Results  Component Value Date   WBC 7.1 09/24/2018   HGB 15.2 (H) 09/24/2018   HCT 45.4 09/24/2018   MCV 87.6 09/24/2018   PLT 254.0 09/24/2018   Lab Results  Component Value Date   NA 141 09/24/2018   K 4.8 09/24/2018   CO2 30 09/24/2018   GLUCOSE 154 (H) 09/24/2018   BUN 13 09/24/2018   CREATININE 0.78 09/24/2018   BILITOT 0.5 09/24/2018   ALKPHOS 69 09/24/2018   AST 15 09/24/2018   ALT 22 09/24/2018   PROT 6.8 09/24/2018   ALBUMIN 4.6 09/24/2018   CALCIUM 9.8 09/24/2018   GFR 76.73 09/24/2018   Lab Results  Component Value Date   CHOL 218 (H) 09/24/2018   Lab Results  Component Value Date   HDL 51.70 09/24/2018   Lab Results  Component Value Date   LDLCALC 142 (H) 09/24/2018   Lab Results  Component Value Date   TRIG 121.0 09/24/2018   Lab Results  Component Value Date   CHOLHDL 4 09/24/2018   Lab Results  Component Value Date   HGBA1C 8.3 (H) 09/24/2018       Assessment & Plan:   Problem List Items Addressed This Visit    Hyperlipidemia    Encouraged heart healthy diet, increase exercise, avoid trans fats, consider a krill  oil cap daily      Relevant Orders   Lipid panel (Completed)   Overweight    Encouraged DASH diet, decrease po intake and increase exercise as tolerated. Needs 7-8 hours of sleep nightly. Avoid trans fats, eat small, frequent meals every 4-5 hours with lean proteins, complex carbs and healthy fats. Minimize simple carbs      Diabetes mellitus (HCC)    hgba1c unacceptable, mildly elevated but stable, minimize simple carbs. Increase exercise as tolerated. Continue current meds      Relevant Orders   Hemoglobin A1c (Completed)   Comprehensive metabolic panel (Completed)   Pelvic pain    Referred to gynecology for work up and further evaluation       Other Visit Diagnoses    RLQ abdominal pain    -  Primary   Relevant Orders   Ambulatory referral to Obstetrics / Gynecology   CBC (Completed)   Urinalysis (Completed)   Urine Culture (Completed)      I am having Jenika Lovvorn maintain her multivitamin, fish oil-omega-3 fatty acids, Krill Oil, niacin, and glucose blood.  No orders of the defined types were  placed in this encounter.    Penni Homans, MD

## 2018-09-27 NOTE — Assessment & Plan Note (Signed)
Referred to gynecology for work up and further evaluation

## 2018-10-07 ENCOUNTER — Encounter: Payer: Self-pay | Admitting: Family Medicine

## 2018-10-13 ENCOUNTER — Encounter: Payer: Self-pay | Admitting: Family

## 2018-10-13 ENCOUNTER — Ambulatory Visit (HOSPITAL_BASED_OUTPATIENT_CLINIC_OR_DEPARTMENT_OTHER)
Admission: RE | Admit: 2018-10-13 | Discharge: 2018-10-13 | Disposition: A | Discharge status: Home / Self Care | Payer: 59 | Source: Ambulatory Visit | Attending: Family | Admitting: Family

## 2018-10-13 ENCOUNTER — Ambulatory Visit: Payer: 59 | Admitting: Family

## 2018-10-13 ENCOUNTER — Telehealth: Payer: Self-pay | Admitting: Family Medicine

## 2018-10-13 VITALS — BP 141/76 | HR 87 | Temp 98.0°F | Resp 18 | Wt 177.0 lb

## 2018-10-13 DIAGNOSIS — R05 Cough: Secondary | ICD-10-CM | POA: Insufficient documentation

## 2018-10-13 DIAGNOSIS — R059 Cough, unspecified: Secondary | ICD-10-CM

## 2018-10-13 DIAGNOSIS — R918 Other nonspecific abnormal finding of lung field: Secondary | ICD-10-CM | POA: Diagnosis not present

## 2018-10-13 DIAGNOSIS — R0602 Shortness of breath: Secondary | ICD-10-CM | POA: Diagnosis not present

## 2018-10-13 LAB — POC INFLUENZA A&B (BINAX/QUICKVUE)
Influenza A, POC: NEGATIVE
Influenza B, POC: NEGATIVE

## 2018-10-13 MED ORDER — AZITHROMYCIN 250 MG PO TABS: ORAL_TABLET | ORAL | 0 refills | Status: DC

## 2018-10-13 MED ORDER — ALBUTEROL SULFATE HFA 108 (90 BASE) MCG/ACT IN AERS: 2.0000 | INHALATION_SPRAY | Freq: Four times a day (QID) | RESPIRATORY_TRACT | 0 refills | Status: DC | PRN

## 2018-10-13 MED ORDER — BENZONATATE 100 MG PO CAPS: 100.0000 mg | ORAL_CAPSULE | Freq: Three times a day (TID) | ORAL | 0 refills | Status: DC | PRN

## 2018-10-13 NOTE — Patient Instructions (Signed)
Please complete chest x-ray on the first floor. We will contact you with your results and further recommendations.  You may use tessalon as needed for cough.  You may use albuterol as needed for wheezing/shortness of breath.  Please call if new/worsening symptoms or if you are not improved in 3-4 days.

## 2018-10-13 NOTE — Progress Notes (Signed)
Subjective:    Patient ID: Jennifer Odom, female    DOB: 10-17-1965, 53 y.o.   MRN: [id number]  HPI  Patient is a 53 yr old female who presents today with chief complaint of cough.  Cough has been present for 5 days.  Reports difficulty taking a deep breath and it hurts to cough. Cough is described as productive.  Reports that she has had associated diarrhea (over the weekend) which has resolve. Has had chills/sweats. Eating and and drinking. Has not taking her temperature but had subjective temp over the weekend. Using sudafed and mucinex. Has had contact with flu in her office.     Review of Systems See HPI  Past Medical History:  Diagnosis Date  . Chicken pox as a child  . CTS (carpal tunnel syndrome) 01/23/2012  . Diabetes mellitus 1994   gestational   . Diabetes mellitus   . Elevated BP   . Esophageal reflux 05/18/2014  . Fatigue 09/25/2011  . GDM (gestational diabetes mellitus)   . Hyperglycemia   . Hyperlipidemia   . Mumps as a child  . Overweight(278.02)   . Preventative health care 09/25/2011     Social History   Socioeconomic History  . Marital status: Divorced    Spouse name: Not on file  . Number of children: Not on file  . Years of education: Not on file  . Highest education level: Not on file  Occupational History  . Not on file  Social Needs  . Financial resource strain: Not on file  . Food insecurity:    Worry: Not on file    Inability: Not on file  . Transportation needs:    Medical: Not on file    Non-medical: Not on file  Tobacco Use  . Smoking status: Never Smoker  . Smokeless tobacco: Never Used  Substance and Sexual Activity  . Alcohol use: Yes    Comment: maybe once a month  . Drug use: No  . Sexual activity: Never  Lifestyle  . Physical activity:    Days per week: Not on file    Minutes per session: Not on file  . Stress: Not on file  Relationships  . Social connections:    Talks on phone: Not on file    Gets together: Not on  file    Attends religious service: Not on file    Active member of club or organization: Not on file    Attends meetings of clubs or organizations: Not on file    Relationship status: Not on file  . Intimate partner violence:    Fear of current or ex partner: Not on file    Emotionally abused: Not on file    Physically abused: Not on file    Forced sexual activity: Not on file  Other Topics Concern  . Not on file  Social History Narrative  . Not on file    Past Surgical History:  Procedure Laterality Date  . APPENDECTOMY     high school  . ECTOPIC PREGNANCY SURGERY  2004   left side?    Family History  Problem Relation Age of Onset  . Cancer Mother        colon  . Diabetes Father        type 2  . Heart disease Father        open heart surgery,triple bypass  . Leukemia Father   . Cancer Father        CLL  . Stroke  Father   . Stroke Maternal Grandmother        X several  . Hypertension Maternal Grandmother   . Emphysema Maternal Grandfather        smoker  . Heart attack Paternal Grandfather   . Heart disease Paternal Grandfather        MI, sudden death  . Endometriosis Sister   . Cancer Maternal Uncle     Allergies  Allergen Reactions  . Sulfa Antibiotics Hives    Current Outpatient Medications on File Prior to Visit  Medication Sig Dispense Refill  . fish oil-omega-3 fatty acids 1000 MG capsule Take 2 g by mouth daily.    Marland Kitchen glucose blood (FREESTYLE LITE) test strip 1 each by Other route as needed for other. 100 each 4  . Krill Oil CAPS Take 1 capsule by mouth daily. 30 capsule 0  . metFORMIN (GLUCOPHAGE-XR) 500 MG 24 hr tablet Take 2 tablets (1,000 mg total) by mouth 2 (two) times daily with a meal. 360 tablet 1  . Multiple Vitamin (MULTIVITAMIN) tablet Take 1 tablet by mouth daily.    . niacin 500 MG CR capsule 1 cap po qhs x 1 week and if tolerated increase to 2 tabs po at bedtime. Take a lowfat snack and an 81 mg aspirin 1/2 hour prior 30 capsule 0    No current facility-administered medications on file prior to visit.     BP (!) 141/76 (BP Location: Right Arm, Cuff Size: Normal)   Pulse 87   Temp 98 F (36.7 C) (Oral)   Resp 18   Wt 177 lb (80.3 kg)   SpO2 100%   BMI 27.72 kg/m       Objective:   Physical Exam Constitutional:      Appearance: Normal appearance. She is well-developed.  HENT:     Right Ear: Tympanic membrane and ear canal normal.     Left Ear: Tympanic membrane and ear canal normal.     Mouth/Throat:     Mouth: Mucous membranes are moist.     Pharynx: No oropharyngeal exudate.  Eyes:     General:        Right eye: No discharge.        Left eye: No discharge.  Neck:     Musculoskeletal: Neck supple.     Thyroid: No thyromegaly.  Cardiovascular:     Rate and Rhythm: Normal rate and regular rhythm.     Heart sounds: Normal heart sounds. No murmur.  Pulmonary:     Effort: Pulmonary effort is normal. No respiratory distress.     Breath sounds: Examination of the right-lower field reveals wheezing and rales. Wheezing and rales present. No rhonchi.  Lymphadenopathy:     Cervical: Cervical adenopathy present.  Skin:    General: Skin is warm and dry.  Neurological:     Mental Status: She is alert and oriented to person, place, and time.  Psychiatric:        Behavior: Behavior normal.        Thought Content: Thought content normal.        Judgment: Judgment normal.           Assessment & Plan:  Abnormal lung exam/cough- negative flu swab. cxr negative for pneumonia but given clinical findings will go ahead and treat for PNA with azithromycin. She was also instructed as follows:   You may use tessalon as needed for cough.  You may use albuterol as needed for wheezing/shortness of breath.  Please call if new/worsening symptoms or if you are not improved in 3-4 days.

## 2018-10-13 NOTE — Telephone Encounter (Signed)
Spoke with patient. Reviewed neg cxr results but advised pt to start zpak empirically given her symptoms and exam findings. Pt verbalizes understanding.

## 2018-10-13 NOTE — Telephone Encounter (Signed)
Copied from CRM [id number]. Topic: Quick Communication - See Telephone Encounter >> Oct 13, 2018 12:50 PM Jennifer Odom wrote: CRM for notification. See Telephone encounter for: 10/13/18. Patient would like to know when her xray results are back could someone please give her a call?

## 2018-10-14 NOTE — Telephone Encounter (Signed)
Per [facility], an attempt was made yesterday to schedule appt, pt can call them back directly to schedule.  Per [facility], appt was scheduled and pt called and cancelled it so they closed the referral.

## 2018-10-16 ENCOUNTER — Telehealth: Payer: Self-pay | Admitting: Family

## 2018-10-16 NOTE — Telephone Encounter (Signed)
Received printed note from [facility] that patient had called to say that she was not feeling better.  I left a detailed message on patient's voicemail for her to call us if she is not improved or if she has any further questions or concerns.

## 2018-10-17 NOTE — Telephone Encounter (Signed)
Tried home phone.  Got message "party unavailable." tried cell phone no answer.

## 2018-10-21 ENCOUNTER — Ambulatory Visit: Payer: 59 | Admitting: Family

## 2018-11-05 ENCOUNTER — Encounter: Payer: Self-pay | Admitting: Family Medicine

## 2018-12-22 ENCOUNTER — Ambulatory Visit (INDEPENDENT_AMBULATORY_CARE_PROVIDER_SITE_OTHER): Payer: 59 | Admitting: Medical

## 2018-12-22 ENCOUNTER — Telehealth: Payer: Self-pay | Admitting: Family Medicine

## 2018-12-22 ENCOUNTER — Other Ambulatory Visit: Payer: Self-pay

## 2018-12-22 ENCOUNTER — Encounter: Payer: Self-pay | Admitting: Medical

## 2018-12-22 DIAGNOSIS — L089 Local infection of the skin and subcutaneous tissue, unspecified: Secondary | ICD-10-CM

## 2018-12-22 DIAGNOSIS — L299 Pruritus, unspecified: Secondary | ICD-10-CM

## 2018-12-22 DIAGNOSIS — T63464A Toxic effect of venom of wasps, undetermined, initial encounter: Secondary | ICD-10-CM | POA: Diagnosis not present

## 2018-12-22 MED ORDER — HYDROXYZINE HCL 25 MG PO TABS: 25.0000 mg | ORAL_TABLET | Freq: Three times a day (TID) | ORAL | 0 refills | Status: DC | PRN

## 2018-12-22 MED ORDER — DOXYCYCLINE HYCLATE 100 MG PO TABS: 100.0000 mg | ORAL_TABLET | Freq: Two times a day (BID) | ORAL | 0 refills | Status: DC

## 2018-12-22 MED ORDER — INSULIN ASPART 100 UNIT/ML ~~LOC~~ SOLN: 3.0000 [IU] | Freq: Three times a day (TID) | SUBCUTANEOUS | 99 refills | Status: DC

## 2018-12-22 MED ORDER — PREDNISONE 10 MG (21) PO TBPK: ORAL_TABLET | ORAL | 0 refills | Status: DC

## 2018-12-22 NOTE — Patient Instructions (Signed)
Patient does appear to have a recent allergic reaction to a wasp sting with concern for secondary skin infection.  The area does appear to be widespread on inspection and therefore counseled patient that we need to watch her closely.  Prescribe 6-day taper dose of prednisone that she will start today.  In addition prescribed hydroxyzine for itching.  For skin infection concern, prescribe doxycycline antibiotic.  Rx advised and given.  Asked patient to start both the prednisone and the doxycycline today.  Patient is diabetic and I did explain adverse side effects of prednisone on her sugar levels.  Did call in NovoLog pen the patient's pharmacy.  Talk with pharmacist and explained sliding scale that she would use if her pre-meal blood sugars are over 200.  Gave sliding scale to pharmacist.  Do want patient to be scheduled on Thursday to evaluate in the right upper extremity to see if the areas are improving.  Do the extent of her rash do think office visit is necessary.  Did also explained to patient that if she has severe worsening rash with redness expanding all the way up her arm then go to the emergency department.

## 2018-12-22 NOTE — Telephone Encounter (Signed)
LVM for pt to call back and schedule follow up appt.

## 2018-12-22 NOTE — Progress Notes (Signed)
   Subjective:    Patient ID: Jennifer Odom, female    DOB: 1966/03/21, 53 y.o.   MRN: [id number]  HPI Virtual Visit via Video Note  I connected with Aljawhara Griebel on 12/22/18 at 10:40 AM EDT by a video enabled telemedicine application and verified that I am speaking with the correct person using two identifiers.  Location: Patient: at work Provider: home.  Patient did not take her vitals today.   I discussed the limitations of evaluation and management by telemedicine and the availability of in person appointments. The patient expressed understanding and agreed to proceed.  History of Present Illness:   Sunday wasp sting to area below antecubital fossae. Benadryl, claritin and benadryl taken but area still itches. The area of rednessis spreading and warm. No fever, no chills or sweats Last 24 hours area  Has red, swollen and warm. The are feels tight and swollen. But no numbness reported to her upper extremity. No dark color changes to skin described.   No sob and no wheezing.  Pt is diabetic and a1c was 8.3 2 months ago.       Observations/Objective: General-no acute distress.  Alert and pleasant. Right upper extremity- on inspection she has obvious redness from medial aspect of her forearm up to be medial aspect of bicep.  By video this area appears to be approximately 12 cm in length.  Also lateral aspect of her forearm appears to be red.  When she palpates these areas she does report warmth.  Assessment and Plan: Patient does appear to have a recent allergic reaction to a wasp sting with concern for secondary skin infection.  The area does appear to be widespread on inspection and therefore counseled patient that we need to watch her closely.  Prescribe 6-day taper dose of prednisone that she will start today.  In addition prescribed hydroxyzine for itching.  For skin infection concern, prescribe doxycycline antibiotic.  Rx advised and given.  Asked patient to  start both the prednisone and the doxycycline today.  Patient is diabetic and I did explain adverse side effects of prednisone on her sugar levels.  Did call in NovoLog pen the patient's pharmacy.  Talk with pharmacist and explained sliding scale that she would use if her pre-meal blood sugars are over 200.  Gave sliding scale to pharmacist.  Do want patient to be scheduled on Thursday to evaluate in the right upper extremity to see if the areas are improving.  Do the extent of her rash do think office visit is necessary.  Did also explained to patient that if she has severe worsening rash with redness expanding all the way up her arm then go to the emergency department.    Follow Up Instructions:    I discussed the assessment and treatment plan with the patient. The patient was provided an opportunity to ask questions and all were answered. The patient agreed with the plan and demonstrated an understanding of the instructions.   The patient was advised to call back or seek an in-person evaluation if the symptoms worsen or if the condition fails to improve as anticipated.     Esperanza Richters, PA-C    Review of Systems     Objective:   Physical Exam        Assessment & Plan:

## 2019-01-13 ENCOUNTER — Other Ambulatory Visit: Payer: Self-pay | Admitting: Medical

## 2019-02-02 LAB — HM HIV SCREENING LAB: HM HIV Screening: NEGATIVE

## 2019-02-02 LAB — HM HEPATITIS C SCREENING LAB: HM Hepatitis Screen: NEGATIVE

## 2019-03-01 ENCOUNTER — Ambulatory Visit: Payer: 59 | Admitting: Obstetrics & Gynecology

## 2019-03-03 LAB — HM PAP SMEAR: HM Pap smear: NEGATIVE

## 2019-03-10 ENCOUNTER — Other Ambulatory Visit: Payer: Self-pay | Admitting: Family Medicine

## 2019-03-17 ENCOUNTER — Ambulatory Visit: Payer: 59 | Admitting: Obstetrics & Gynecology

## 2020-01-16 IMAGING — DX DG CHEST 2V
2 series · 2 of 2 positions shown · non-contrast
Comparison: None.

CLINICAL DATA: Cough, congestion shortness of breath for 5 days,
worsening.

EXAM:
CHEST - 2 VIEW

[chest pa]
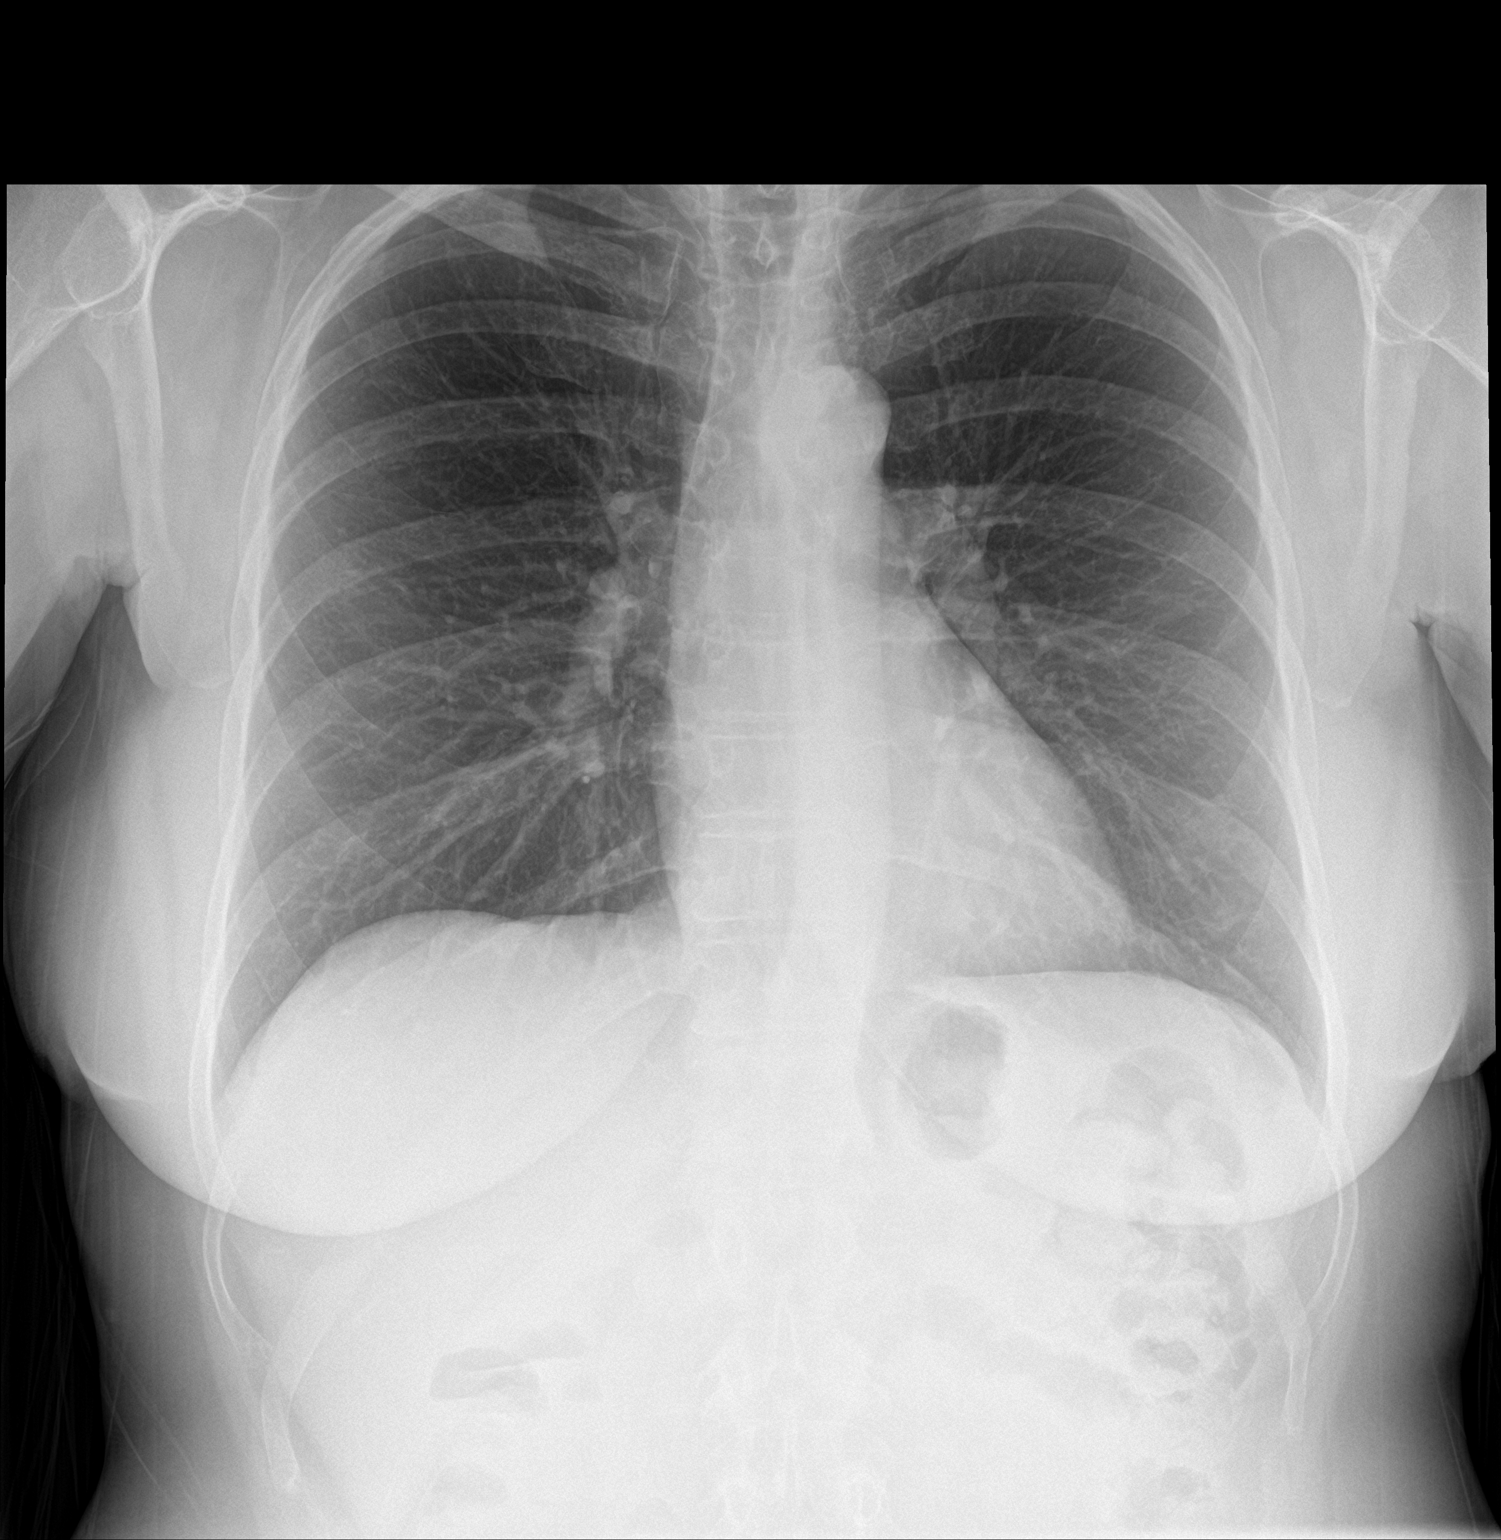

[chest lat]
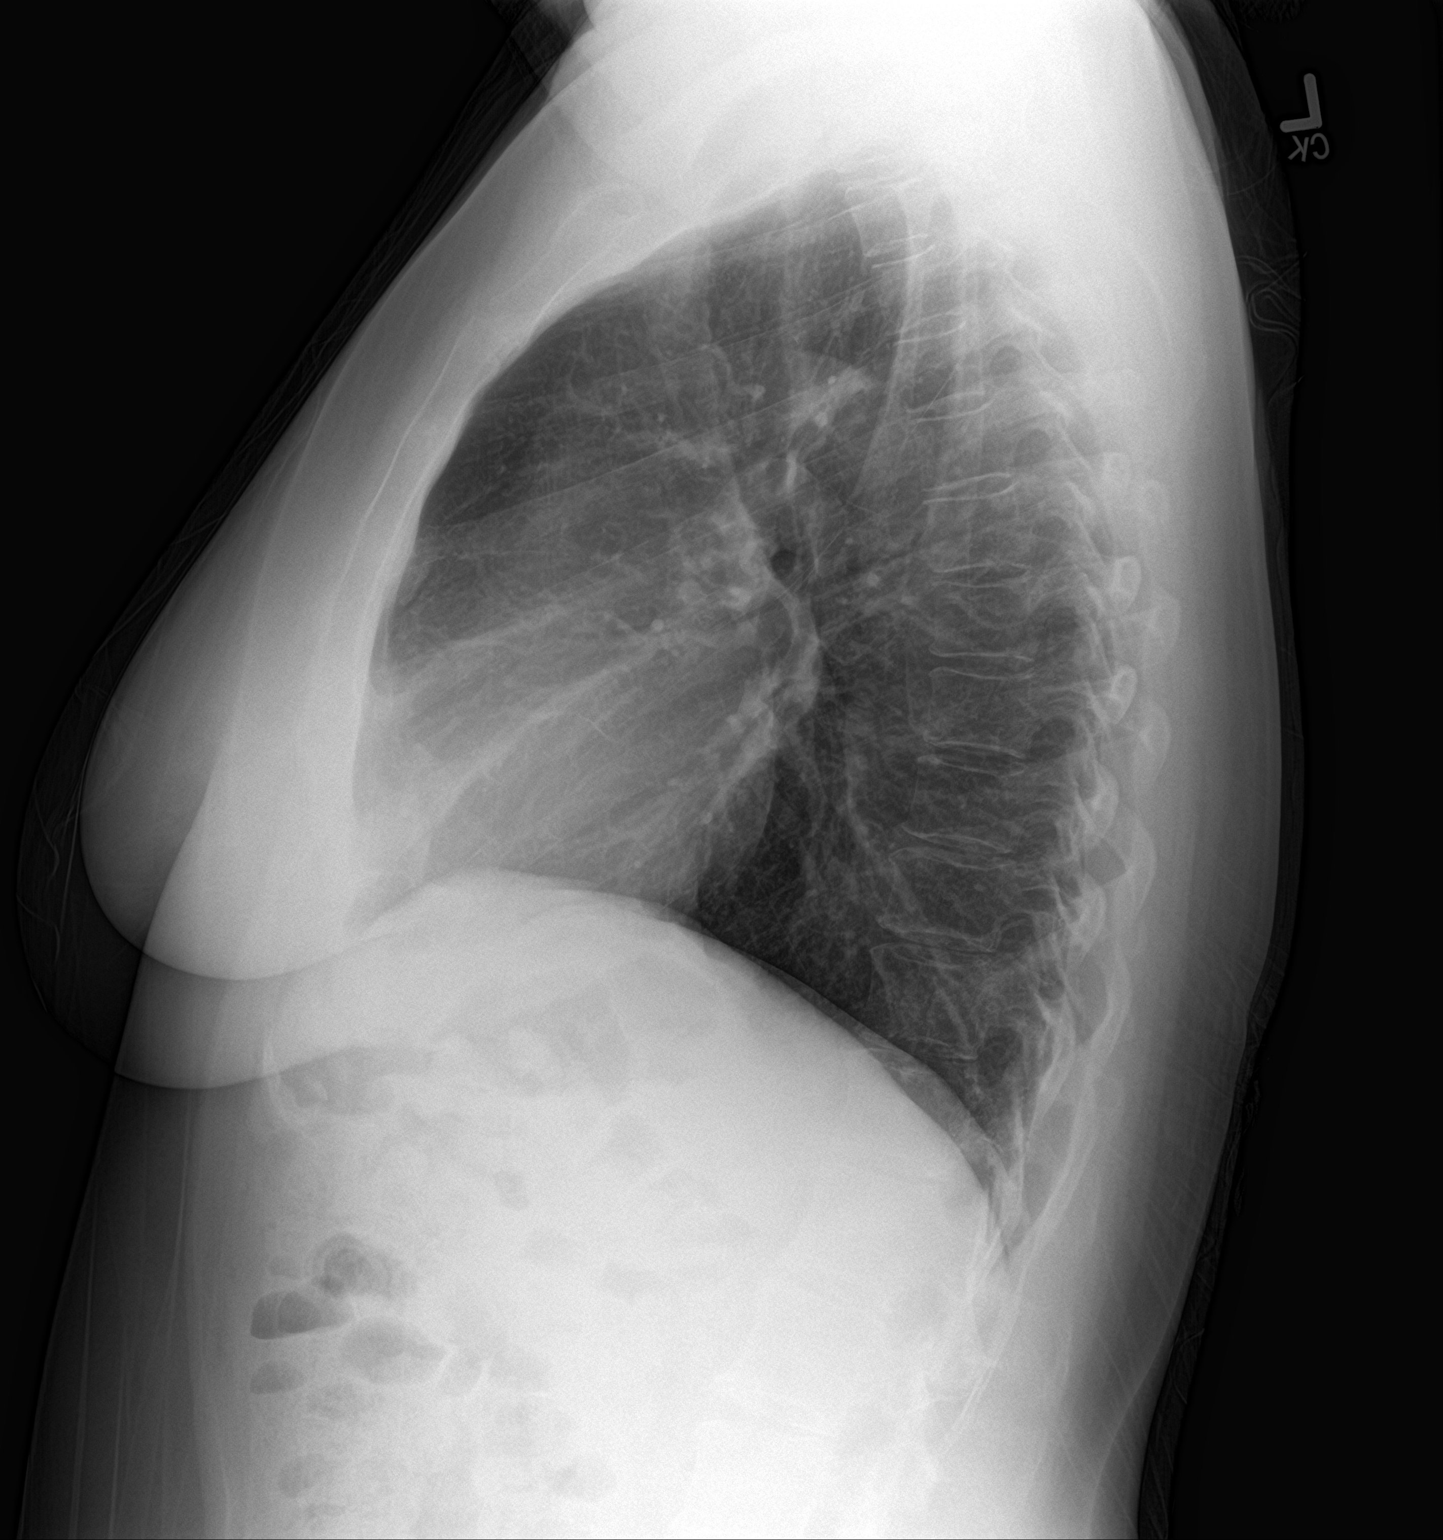

[2 of 2 positions shown; findings below may reference images not displayed]

FINDINGS: The heart size and mediastinal contours are within normal limits.
Both lungs are clear. The visualized skeletal structures are
unremarkable.
IMPRESSION: No active cardiopulmonary disease.  No evidence of pneumonia.

## 2020-01-27 ENCOUNTER — Other Ambulatory Visit: Payer: Self-pay | Admitting: Family Medicine

## 2020-02-18 ENCOUNTER — Other Ambulatory Visit: Payer: Self-pay | Admitting: Family Medicine

## 2020-02-23 ENCOUNTER — Encounter: Payer: Self-pay | Admitting: Family Medicine

## 2020-02-23 MED ORDER — METFORMIN HCL ER 500 MG PO TB24: 1000.0000 mg | ORAL_TABLET | Freq: Two times a day (BID) | ORAL | 0 refills | Status: DC

## 2020-03-17 ENCOUNTER — Other Ambulatory Visit: Payer: Self-pay | Admitting: *Deleted

## 2020-03-17 MED ORDER — METFORMIN HCL ER 500 MG PO TB24: 1000.0000 mg | ORAL_TABLET | Freq: Two times a day (BID) | ORAL | 0 refills | Status: DC

## 2020-03-20 ENCOUNTER — Other Ambulatory Visit: Payer: Self-pay | Admitting: Family Medicine

## 2020-04-02 ENCOUNTER — Encounter: Payer: Self-pay | Admitting: Family

## 2020-04-04 ENCOUNTER — Ambulatory Visit: Payer: 59 | Admitting: Family

## 2020-04-11 ENCOUNTER — Ambulatory Visit: Payer: 59 | Admitting: Family

## 2020-04-18 ENCOUNTER — Other Ambulatory Visit: Payer: Self-pay | Admitting: Family Medicine

## 2020-05-17 ENCOUNTER — Other Ambulatory Visit: Payer: Self-pay | Admitting: Family Medicine

## 2020-05-17 MED ORDER — METFORMIN HCL ER 500 MG PO TB24: 1000.0000 mg | ORAL_TABLET | Freq: Two times a day (BID) | ORAL | 0 refills | Status: DC

## 2020-05-17 NOTE — Telephone Encounter (Signed)
Rx sent 

## 2020-05-17 NOTE — Telephone Encounter (Signed)
Pt had to reschedule her appt since pt was sick, pt reschedule her appt (fu appt for medication refill) on Jul 06, 2020 at 3:00 since provider does not have anything sooner, pt is needing refills (til her next appt on Nov. 18, 2021) on metaformin send to [facility] in [place]. Please advise.

## 2020-07-06 ENCOUNTER — Telehealth: Payer: Self-pay | Admitting: Family Medicine

## 2020-07-07 ENCOUNTER — Other Ambulatory Visit: Payer: Self-pay

## 2020-07-07 ENCOUNTER — Telehealth: Payer: Self-pay

## 2020-07-07 ENCOUNTER — Encounter: Payer: Self-pay | Admitting: Family Medicine

## 2020-07-07 ENCOUNTER — Telehealth (INDEPENDENT_AMBULATORY_CARE_PROVIDER_SITE_OTHER): Payer: PRIVATE HEALTH INSURANCE | Admitting: Family Medicine

## 2020-07-07 DIAGNOSIS — E663 Overweight: Secondary | ICD-10-CM | POA: Diagnosis not present

## 2020-07-07 DIAGNOSIS — E785 Hyperlipidemia, unspecified: Secondary | ICD-10-CM

## 2020-07-07 DIAGNOSIS — F4541 Pain disorder exclusively related to psychological factors: Secondary | ICD-10-CM

## 2020-07-07 DIAGNOSIS — E119 Type 2 diabetes mellitus without complications: Secondary | ICD-10-CM

## 2020-07-07 NOTE — Patient Instructions (Signed)
needs a a lab appt in next 2 weeks, also needs a nurse appt for Tdap and a CPE in 6-7 months

## 2020-07-07 NOTE — Assessment & Plan Note (Signed)
She has been exercising more and working with a nutritionist at work and reports weight loss of about 10 pounds.

## 2020-07-07 NOTE — Assessment & Plan Note (Signed)
hgba1c mildly high, minimize simple carbs. Increase exercise as tolerated. Continue current meds, labs ordered today she will come in after Thanksgiving for labs

## 2020-07-07 NOTE — Assessment & Plan Note (Signed)
Encouraged heart healthy diet, increase exercise, avoid trans fats, consider a krill oil cap daily 

## 2020-07-07 NOTE — Telephone Encounter (Signed)
Attempted to call pt to get pt set up for appointment per Dr. Abner Greenspan. I put instructions in wrap up

## 2020-07-07 NOTE — Assessment & Plan Note (Signed)
No c/o recent exacerbation 

## 2020-07-07 NOTE — Progress Notes (Signed)
Virtual Visit via Video Note  I connected with Jennifer Odom on 07/07/20 at  9:00 AM EST by a video enabled telemedicine application and verified that I am speaking with the correct person using two identifiers.  Location: Patient: her office, patient and provider in visit Provider: office   I discussed the limitations of evaluation and management by telemedicine and the availability of in person appointments. The patient expressed understanding and agreed to proceed. S Chism, CMA was able to get the patient set up on a video visit.     Subjective:    Patient ID: Jennifer Odom, female    DOB: 1965/11/01, 54 y.o.   MRN: [id number]  Chief Complaint  Patient presents with  . Follow-up    HPI Patient is in today for follow up on chronic medical concerns. No recent febrile illness or hospitalizations. No polyuria or polydipsia. She is working with a nutritionist at work and exercising more and feels better. She took 1 COVID shot and had side effects of headache and chest tightness and was afraid to take another one. Denies CP/palp/SOB/HA/congestion/fevers/GI or GU c/o. Taking meds as prescribed  Past Medical History:  Diagnosis Date  . Chicken pox as a child  . CTS (carpal tunnel syndrome) 01/23/2012  . Diabetes mellitus 1994   gestational   . Diabetes mellitus   . Elevated BP   . Esophageal reflux 05/18/2014  . Fatigue 09/25/2011  . GDM (gestational diabetes mellitus)   . Hyperglycemia   . Hyperlipidemia   . Mumps as a child  . Overweight(278.02)   . Preventative health care 09/25/2011    Past Surgical History:  Procedure Laterality Date  . APPENDECTOMY     high school  . ECTOPIC PREGNANCY SURGERY  2004   left side?    Family History  Problem Relation Age of Onset  . Cancer Mother        colon  . Diabetes Father        type 2  . Heart disease Father        open heart surgery,triple bypass  . Leukemia Father   . Cancer Father        CLL  . Stroke Father   .  Stroke Maternal Grandmother        X several  . Hypertension Maternal Grandmother   . Emphysema Maternal Grandfather        smoker  . Heart attack Paternal Grandfather   . Heart disease Paternal Grandfather        MI, sudden death  . Endometriosis Jennifer   . Cancer Maternal Uncle     Social History   Socioeconomic History  . Marital status: Divorced    Spouse name: Not on file  . Number of children: Not on file  . Years of education: Not on file  . Highest education level: Not on file  Occupational History  . Not on file  Tobacco Use  . Smoking status: Never Smoker  . Smokeless tobacco: Never Used  Substance and Sexual Activity  . Alcohol use: Yes    Comment: maybe once a month  . Drug use: No  . Sexual activity: Never  Other Topics Concern  . Not on file  Social History Narrative  . Not on file   Social Determinants of Health   Financial Resource Strain:   . Difficulty of Paying Living Expenses: Not on file  Food Insecurity:   . Worried About Programme researcher, broadcasting/film/video in the Last Year: Not  on file  . Ran Out of Food in the Last Year: Not on file  Transportation Needs:   . Lack of Transportation (Medical): Not on file  . Lack of Transportation (Non-Medical): Not on file  Physical Activity:   . Days of Exercise per Week: Not on file  . Minutes of Exercise per Session: Not on file  Stress:   . Feeling of Stress : Not on file  Social Connections:   . Frequency of Communication with Friends and Family: Not on file  . Frequency of Social Gatherings with Friends and Family: Not on file  . Attends Religious Services: Not on file  . Active Member of Clubs or Organizations: Not on file  . Attends Banker Meetings: Not on file  . Marital Status: Not on file  Intimate Partner Violence:   . Fear of Current or Ex-Partner: Not on file  . Emotionally Abused: Not on file  . Physically Abused: Not on file  . Sexually Abused: Not on file    Outpatient Medications  Prior to Visit  Medication Sig Dispense Refill  . albuterol (PROVENTIL HFA;VENTOLIN HFA) 108 (90 Base) MCG/ACT inhaler Inhale 2 puffs into the lungs every 6 (six) hours as needed for wheezing or shortness of breath. 1 Inhaler 0  . azithromycin (ZITHROMAX) 250 MG tablet Take 2 tabs by mouth today, then 1 tab once daily for 4 more days. 6 tablet 0  . benzonatate (TESSALON) 100 MG capsule Take 1 capsule (100 mg total) by mouth 3 (three) times daily as needed. 20 capsule 0  . fish oil-omega-3 fatty acids 1000 MG capsule Take 2 g by mouth daily.    Marland Kitchen glucose blood (FREESTYLE LITE) test strip 1 each by Other route as needed for other. 100 each 4  . Krill Oil CAPS Take 1 capsule by mouth daily. 30 capsule 0  . metFORMIN (GLUCOPHAGE-XR) 500 MG 24 hr tablet Take 2 tablets (1,000 mg total) by mouth 2 (two) times daily with a meal. 360 tablet 0  . Multiple Vitamin (MULTIVITAMIN) tablet Take 1 tablet by mouth daily.    Marland Kitchen doxycycline (VIBRA-TABS) 100 MG tablet Take 1 tablet (100 mg total) by mouth 2 (two) times daily. Can give caps or generic 20 tablet 0  . HUMALOG KWIKPEN 100 UNIT/ML KwikPen USE 3 TIMES A DAY BEFORE MEALS FOR SUGARS GREATER THAN 200 ( IF SUGAR 200-240 PREMEAL USE 3 UNITS, 241-300 USE 4 UNITS, 301-350 USE 6 UNITS, 351-400 USE 8 UNITS) 15 mL 2  . hydrOXYzine (ATARAX/VISTARIL) 25 MG tablet Take 1 tablet (25 mg total) by mouth every 8 (eight) hours as needed for itching. 30 tablet 0  . insulin aspart (NOVOLOG) 100 UNIT/ML injection Inject 3 Units into the skin 3 (three) times daily before meals. Follow sliding scale for premeal sugars over 200.  200-240 sugar premeal 3 units 241-300 sugar premeal 4 units. 301-350 sugar premeal 6 units 351-400 sugar premeal 8 units,. 3 vial PRN  . niacin 500 MG CR capsule 1 cap po qhs x 1 week and if tolerated increase to 2 tabs po at bedtime. Take a lowfat snack and an 81 mg aspirin 1/2 hour prior 30 capsule 0  . predniSONE (STERAPRED UNI-PAK 21 TAB) 10 MG (21)  TBPK tablet Taper over 6 days. 21 tablet 0   No facility-administered medications prior to visit.    Allergies  Allergen Reactions  . Sulfa Antibiotics Hives    Review of Systems  Constitutional: Negative for fever and malaise/fatigue.  HENT: Negative for congestion.   Eyes: Negative for blurred vision.  Respiratory: Negative for shortness of breath.   Cardiovascular: Negative for chest pain, palpitations and leg swelling.  Gastrointestinal: Negative for abdominal pain, blood in stool and nausea.  Genitourinary: Negative for dysuria and frequency.  Musculoskeletal: Negative for falls.  Skin: Negative for rash.  Neurological: Negative for dizziness, loss of consciousness and headaches.  Endo/Heme/Allergies: Negative for environmental allergies.  Psychiatric/Behavioral: Negative for depression. The patient is not nervous/anxious.        Objective:    Physical Exam Constitutional:      Appearance: Normal appearance. She is not ill-appearing.  HENT:     Head: Normocephalic and atraumatic.     Nose: Nose normal.  Eyes:     General:        Right eye: No discharge.        Left eye: No discharge.  Pulmonary:     Effort: Pulmonary effort is normal.  Neurological:     Mental Status: She is alert and oriented to person, place, and time.  Psychiatric:        Behavior: Behavior normal.     There were no vitals taken for this visit. Wt Readings from Last 3 Encounters:  10/13/18 177 lb (80.3 kg)  09/24/18 174 lb 9.6 oz (79.2 kg)  01/22/18 176 lb 3.2 oz (79.9 kg)    Diabetic Foot Exam - Simple   No data filed     Lab Results  Component Value Date   WBC 7.1 09/24/2018   HGB 15.2 (H) 09/24/2018   HCT 45.4 09/24/2018   PLT 254.0 09/24/2018   GLUCOSE 154 (H) 09/24/2018   CHOL 218 (H) 09/24/2018   TRIG 121.0 09/24/2018   HDL 51.70 09/24/2018   LDLDIRECT 163.9 01/23/2012   LDLCALC 142 (H) 09/24/2018   ALT 22 09/24/2018   AST 15 09/24/2018   NA 141 09/24/2018   K 4.8  09/24/2018   CL 105 09/24/2018   CREATININE 0.78 09/24/2018   BUN 13 09/24/2018   CO2 30 09/24/2018   TSH 1.91 01/22/2018   HGBA1C 8.3 (H) 09/24/2018   MICROALBUR <0.7 06/05/2015    Lab Results  Component Value Date   TSH 1.91 01/22/2018   Lab Results  Component Value Date   WBC 7.1 09/24/2018   HGB 15.2 (H) 09/24/2018   HCT 45.4 09/24/2018   MCV 87.6 09/24/2018   PLT 254.0 09/24/2018   Lab Results  Component Value Date   NA 141 09/24/2018   K 4.8 09/24/2018   CO2 30 09/24/2018   GLUCOSE 154 (H) 09/24/2018   BUN 13 09/24/2018   CREATININE 0.78 09/24/2018   BILITOT 0.5 09/24/2018   ALKPHOS 69 09/24/2018   AST 15 09/24/2018   ALT 22 09/24/2018   PROT 6.8 09/24/2018   ALBUMIN 4.6 09/24/2018   CALCIUM 9.8 09/24/2018   GFR 76.73 09/24/2018   Lab Results  Component Value Date   CHOL 218 (H) 09/24/2018   Lab Results  Component Value Date   HDL 51.70 09/24/2018   Lab Results  Component Value Date   LDLCALC 142 (H) 09/24/2018   Lab Results  Component Value Date   TRIG 121.0 09/24/2018   Lab Results  Component Value Date   CHOLHDL 4 09/24/2018   Lab Results  Component Value Date   HGBA1C 8.3 (H) 09/24/2018       Assessment & Plan:   Problem List Items Addressed This Visit    Hyperlipidemia -  Primary    Encouraged heart healthy diet, increase exercise, avoid trans fats, consider a krill oil cap daily      Relevant Orders   Hemoglobin A1c   CBC   Comprehensive metabolic panel   Lipid panel   TSH   Overweight    She has been exercising more and working with a nutritionist at work and reports weight loss of about 10 pounds.       Diabetes mellitus (HCC)    hgba1c mildly high, minimize simple carbs. Increase exercise as tolerated. Continue current meds, labs ordered today she will come in after Thanksgiving for labs      Relevant Orders   Hemoglobin A1c   CBC   Comprehensive metabolic panel   Lipid panel   TSH   Stress headaches    No c/o  recent exacerbation      Relevant Orders   CBC   Comprehensive metabolic panel   TSH      I have discontinued Clydie BraunKaren Bembenek's niacin, benzonatate, albuterol, azithromycin, doxycycline, predniSONE, insulin aspart, hydrOXYzine, and HumaLOG KwikPen. I am also having her maintain her multivitamin, fish oil-omega-3 fatty acids, Krill Oil, glucose blood, and metFORMIN.  No orders of the defined types were placed in this encounter.    I discussed the assessment and treatment plan with the patient. The patient was provided an opportunity to ask questions and all were answered. The patient agreed with the plan and demonstrated an understanding of the instructions.   The patient was advised to call back or seek an in-person evaluation if the symptoms worsen or if the condition fails to improve as anticipated.  I provided 20 minutes of face-to-face time during this encounter.   Danise EdgeStacey Jonthan Leite, MD

## 2020-08-11 ENCOUNTER — Other Ambulatory Visit: Payer: Self-pay | Admitting: Family Medicine

## 2020-08-29 ENCOUNTER — Telehealth: Payer: Self-pay

## 2020-08-29 NOTE — Telephone Encounter (Signed)
Done. Patient already had CPE IN NOV. Just needed labs. scheuled pt lab app. For Friday

## 2020-08-29 NOTE — Telephone Encounter (Signed)
Caller states that she has an appointment tomorrow morning at 8AM for annual lab work. She would like to have the appointment changed to Friday if possible.  Additional Comment Caller would like a call back to see about getting her lab appointment changed to Friday.  Telephone: [phone]

## 2020-08-30 ENCOUNTER — Encounter: Payer: PRIVATE HEALTH INSURANCE | Admitting: Family

## 2020-09-01 ENCOUNTER — Other Ambulatory Visit: Payer: PRIVATE HEALTH INSURANCE

## 2020-11-06 ENCOUNTER — Other Ambulatory Visit: Payer: Self-pay | Admitting: Family Medicine

## 2021-01-30 ENCOUNTER — Encounter: Payer: Self-pay | Admitting: Family Medicine

## 2021-02-21 ENCOUNTER — Telehealth: Payer: Self-pay

## 2021-02-23 ENCOUNTER — Ambulatory Visit (INDEPENDENT_AMBULATORY_CARE_PROVIDER_SITE_OTHER): Payer: PRIVATE HEALTH INSURANCE | Admitting: Family Medicine

## 2021-02-23 ENCOUNTER — Encounter: Payer: Self-pay | Admitting: Family Medicine

## 2021-02-23 ENCOUNTER — Other Ambulatory Visit: Payer: Self-pay

## 2021-02-23 ENCOUNTER — Other Ambulatory Visit (INDEPENDENT_AMBULATORY_CARE_PROVIDER_SITE_OTHER): Payer: PRIVATE HEALTH INSURANCE

## 2021-02-23 VITALS — BP 136/84 | HR 77 | Temp 97.7°F | Resp 18 | Ht 67.0 in | Wt 176.8 lb

## 2021-02-23 DIAGNOSIS — E119 Type 2 diabetes mellitus without complications: Secondary | ICD-10-CM | POA: Diagnosis not present

## 2021-02-23 DIAGNOSIS — F4541 Pain disorder exclusively related to psychological factors: Secondary | ICD-10-CM

## 2021-02-23 DIAGNOSIS — E785 Hyperlipidemia, unspecified: Secondary | ICD-10-CM | POA: Diagnosis not present

## 2021-02-23 DIAGNOSIS — M545 Low back pain, unspecified: Secondary | ICD-10-CM | POA: Insufficient documentation

## 2021-02-23 LAB — LIPID PANEL
Cholesterol: 259 mg/dL — ABNORMAL HIGH (ref 0–200)
HDL: 52.4 mg/dL (ref >39.00)
LDL Cholesterol: 183 mg/dL — ABNORMAL HIGH (ref 0–99)
NonHDL: 206.8
Total CHOL/HDL Ratio: 5
Triglycerides: 120 mg/dL (ref 0.0–149.0)
VLDL: 24 mg/dL (ref 0.0–40.0)

## 2021-02-23 LAB — COMPREHENSIVE METABOLIC PANEL
ALT: 20 U/L (ref 0–35)
AST: 17 U/L (ref 0–37)
Albumin: 4.6 g/dL (ref 3.5–5.2)
Alkaline Phosphatase: 61 U/L (ref 39–117)
BUN: 16 mg/dL (ref 6–23)
CO2: 28 mEq/L (ref 19–32)
Calcium: 9.8 mg/dL (ref 8.4–10.5)
Chloride: 101 mEq/L (ref 96–112)
Creatinine, Ser: 0.77 mg/dL (ref 0.40–1.20)
GFR: 85.75 mL/min (ref >60.00)
Glucose, Bld: 93 mg/dL (ref 70–99)
Potassium: 4.3 mEq/L (ref 3.5–5.1)
Sodium: 137 mEq/L (ref 135–145)
Total Bilirubin: 0.5 mg/dL (ref 0.2–1.2)
Total Protein: 6.9 g/dL (ref 6.0–8.3)

## 2021-02-23 LAB — CBC
HCT: 40.1 % (ref 36.0–46.0)
Hemoglobin: 13.4 g/dL (ref 12.0–15.0)
MCHC: 33.5 g/dL (ref 30.0–36.0)
MCV: 84.8 fl (ref 78.0–100.0)
Platelets: 221 10*3/uL (ref 150.0–400.0)
RBC: 4.73 Mil/uL (ref 3.87–5.11)
RDW: 14 % (ref 11.5–15.5)
WBC: 8.3 10*3/uL (ref 4.0–10.5)

## 2021-02-23 LAB — TSH: TSH: 3.66 u[IU]/mL (ref 0.35–5.50)

## 2021-02-23 LAB — HEMOGLOBIN A1C: Hgb A1c MFr Bld: 8.4 % — ABNORMAL HIGH (ref 4.6–6.5)

## 2021-02-23 MED ORDER — MELOXICAM 7.5 MG PO TABS: ORAL_TABLET | ORAL | 2 refills | Status: DC

## 2021-02-23 NOTE — Progress Notes (Signed)
Patient ID: Jennifer Odom, female    DOB: 1966-04-05  Age: 55 y.o. MRN: [id number]    Subjective:  Subjective  HPI Jennifer Odom presents for an office visit today. She complains of back pain. She notes that she had fallen on her R hip x 2 weeks. She states that she didn't feel any pain after the incident however after 4 days the pain started. She reports that she felt tingling on the top of her bilateral thigh, however it had resolved. She notes that the pain was at the worst on 02/17/2021 where she was unable to move, however it had improved overtime. She states that the pain worsen with bending 90 degree, shifting her body, and when she sits too long. She endorses taking Advil and tylenol, and applying heat (shower and heating pad), help relief her symptoms greatly.  She denies any chest pain, SOB, fever, abdominal pain, cough, chills, sore throat, dysuria, urinary incontinence, HA, or N/V/D at this time. Pt notes taking tumeric. Pt is planning to attend PT to strengthen her core.   Review of Systems  Constitutional:  Negative for chills, fatigue and fever.  HENT:  Negative for ear pain, rhinorrhea, sinus pressure, sinus pain, sore throat and tinnitus.   Eyes:  Negative for pain.  Respiratory:  Negative for cough, shortness of breath and wheezing.   Cardiovascular:  Negative for chest pain.  Gastrointestinal:  Negative for abdominal pain, anal bleeding, constipation, diarrhea, nausea and vomiting.  Genitourinary:  Negative for flank pain.  Musculoskeletal:  Positive for back pain (acute). Negative for neck pain.  Skin:  Negative for rash.  Neurological:  Negative for seizures, weakness, light-headedness, numbness and headaches.   History Past Medical History:  Diagnosis Date   Chicken pox as a child   CTS (carpal tunnel syndrome) 01/23/2012   Diabetes mellitus 1994   gestational    Diabetes mellitus    Elevated BP    Esophageal reflux 05/18/2014   Fatigue 09/25/2011   GDM  (gestational diabetes mellitus)    Hyperglycemia    Hyperlipidemia    Mumps as a child   Overweight(278.02)    Preventative health care 09/25/2011    She has a past surgical history that includes Appendectomy and Ectopic pregnancy surgery (2004).   Her family history includes Cancer in her father, maternal uncle, and mother; Diabetes in her father; Emphysema in her maternal grandfather; Endometriosis in her sister; Heart attack in her paternal grandfather; Heart disease in her father and paternal grandfather; Hypertension in her maternal grandmother; Leukemia in her father; Stroke in her father and maternal grandmother.She reports that she has never smoked. She has never used smokeless tobacco. She reports current alcohol use. She reports that she does not use drugs.  Current Outpatient Medications on File Prior to Visit  Medication Sig Dispense Refill   fish oil-omega-3 fatty acids 1000 MG capsule Take 2 g by mouth daily.     glucose blood (FREESTYLE LITE) test strip 1 each by Other route as needed for other. 100 each 4   Krill Oil CAPS Take 1 capsule by mouth daily. 30 capsule 0   metFORMIN (GLUCOPHAGE-XR) 500 MG 24 hr tablet Take 2 tablets (1,000 mg total) by mouth 2 (two) times daily with a meal. 360 tablet 1   Multiple Vitamin (MULTIVITAMIN) tablet Take 1 tablet by mouth daily.     No current facility-administered medications on file prior to visit.     Objective:  Objective  Physical Exam Vitals and nursing note  reviewed.  Constitutional:      General: She is not in acute distress.    Appearance: Normal appearance. She is well-developed. She is not ill-appearing.  HENT:     Head: Normocephalic and atraumatic.     Right Ear: External ear normal.     Left Ear: External ear normal.     Nose: Nose normal.  Eyes:     General:        Right eye: No discharge.        Left eye: No discharge.     Extraocular Movements: Extraocular movements intact.     Pupils: Pupils are equal,  round, and reactive to light.  Cardiovascular:     Rate and Rhythm: Normal rate and regular rhythm.     Pulses: Normal pulses.     Heart sounds: Normal heart sounds. No murmur heard.   No friction rub. No gallop.  Pulmonary:     Effort: Pulmonary effort is normal. No respiratory distress.     Breath sounds: Normal breath sounds. No stridor. No wheezing, rhonchi or rales.  Chest:     Chest wall: No tenderness.  Abdominal:     General: Bowel sounds are normal. There is no distension.     Palpations: Abdomen is soft. There is no mass.     Tenderness: There is no abdominal tenderness. There is no guarding or rebound.     Hernia: No hernia is present.  Musculoskeletal:        General: Tenderness and signs of injury present. No swelling or deformity. Normal range of motion.     Cervical back: Normal, normal range of motion and neck supple.     Thoracic back: Normal.     Lumbar back: Normal.     Right lower leg: No edema.     Left lower leg: No edema.  Skin:    General: Skin is warm and dry.  Neurological:     General: No focal deficit present.     Mental Status: She is alert and oriented to person, place, and time.     Motor: Motor function is intact. No weakness.     Coordination: Coordination normal.     Gait: Gait normal.     Deep Tendon Reflexes: Reflexes are normal and symmetric. Reflexes normal.     Comments: There is symmetrical and normal bilateral patellar DTR present. There is 5/5 strength present in the bilateral LE.   Psychiatric:        Behavior: Behavior normal.        Thought Content: Thought content normal.   BP 136/84 (BP Location: Right Arm, Patient Position: Sitting, Cuff Size: Normal)   Pulse 77   Temp 97.7 F (36.5 C) (Oral)   Resp 18   Ht 5\' 7"  (1.702 m)   Wt 176 lb 12.8 oz (80.2 kg)   SpO2 97%   BMI 27.69 kg/m  Wt Readings from Last 3 Encounters:  02/23/21 176 lb 12.8 oz (80.2 kg)  10/13/18 177 lb (80.3 kg)  09/24/18 174 lb 9.6 oz (79.2 kg)      Lab Results  Component Value Date   WBC 7.1 09/24/2018   HGB 15.2 (H) 09/24/2018   HCT 45.4 09/24/2018   PLT 254.0 09/24/2018   GLUCOSE 154 (H) 09/24/2018   CHOL 218 (H) 09/24/2018   TRIG 121.0 09/24/2018   HDL 51.70 09/24/2018   LDLDIRECT 163.9 01/23/2012   LDLCALC 142 (H) 09/24/2018   ALT 22 09/24/2018   AST 15  09/24/2018   NA 141 09/24/2018   K 4.8 09/24/2018   CL 105 09/24/2018   CREATININE 0.78 09/24/2018   BUN 13 09/24/2018   CO2 30 09/24/2018   TSH 1.91 01/22/2018   HGBA1C 8.3 (H) 09/24/2018   MICROALBUR <0.7 06/05/2015    DG Chest 2 View  Result Date: 10/13/2018 CLINICAL DATA:  Cough, congestion shortness of breath for 5 days, worsening. EXAM: CHEST - 2 VIEW COMPARISON:  None. FINDINGS: The heart size and mediastinal contours are within normal limits. Both lungs are clear. The visualized skeletal structures are unremarkable. IMPRESSION: No active cardiopulmonary disease.  No evidence of pneumonia. Electronically Signed   By: Bary Richard M.D.   On: 10/13/2018 11:31     Assessment & Plan:  Plan    Meds ordered this encounter  Medications   meloxicam (MOBIC) 7.5 MG tablet    Sig: 1-2 po qd prn    Dispense:  30 tablet    Refill:  2    Problem List Items Addressed This Visit       Unprioritized   Low back pain with radiation - Primary    Pain is improving and she feels much better today than previously--- mobic prn pain  Pt did not want muscel relaxer  Xray ordered ---  If pain worsens or does not completely go away she will get xray        Relevant Medications   meloxicam (MOBIC) 7.5 MG tablet   Other Relevant Orders   DG Lumbar Spine Complete    Follow-up: Return if symptoms worsen or fail to improve.   I,Gordon Zheng,acting as a Neurosurgeon for [employer], DO.,have documented all relevant documentation on the behalf of Donato Schultz, DO,as directed by  Donato Schultz, DO while in the presence of Donato Schultz,  DO.  I, Donato Schultz, DO, have reviewed all documentation for this visit. The documentation on 02/23/21 for the exam, diagnosis, procedures, and orders are all accurate and complete.

## 2021-02-23 NOTE — Assessment & Plan Note (Signed)
Pain is improving and she feels much better today than previously--- mobic prn pain  Pt did not want muscel relaxer  Xray ordered ---  If pain worsens or does not completely go away she will get xray

## 2021-02-23 NOTE — Patient Instructions (Signed)
Acute Back Pain, Adult Acute back pain is sudden and usually short-lived. It is often caused by an injury to the muscles and tissues in the back. The injury may result from: A muscle or ligament getting overstretched or torn (strained). Ligaments are tissues that connect bones to each other. Lifting something improperly can cause a back strain. Wear and tear (degeneration) of the spinal disks. Spinal disks are circular tissue that provide cushioning between the bones of the spine (vertebrae). Twisting motions, such as while playing sports or doing yard work. A hit to the back. Arthritis. You may have a physical exam, lab tests, and imaging tests to find the cause ofyour pain. Acute back pain usually goes away with rest and home care. Follow these instructions at home: Managing pain, stiffness, and swelling Treatment may include medicines for pain and inflammation that are taken by mouth or applied to the skin, prescription pain medicine, or muscle relaxants. Take over-the-counter and prescription medicines only as told by your health care provider. Your health care provider may recommend applying ice during the first 24-48 hours after your pain starts. To do this: Put ice in a plastic bag. Place a towel between your skin and the bag. Leave the ice on for 20 minutes, 2-3 times a day. If directed, apply heat to the affected area as often as told by your health care provider. Use the heat source that your health care provider recommends, such as a moist heat pack or a heating pad. Place a towel between your skin and the heat source. Leave the heat on for 20-30 minutes. Remove the heat if your skin turns bright red. This is especially important if you are unable to feel pain, heat, or cold. You have a greater risk of getting burned. Activity  Do not stay in bed. Staying in bed for more than 1-2 days can delay your recovery. Sit up and stand up straight. Avoid leaning forward when you sit or  hunching over when you stand. If you work at a desk, sit close to it so you do not need to lean over. Keep your chin tucked in. Keep your neck drawn back, and keep your elbows bent at a 90-degree angle (right angle). Sit high and close to the steering wheel when you drive. Add lower back (lumbar) support to your car seat, if needed. Take short walks on even surfaces as soon as you are able. Try to increase the length of time you walk each day. Do not sit, drive, or stand in one place for more than 30 minutes at a time. Sitting or standing for long periods of time can put stress on your back. Do not drive or use heavy machinery while taking prescription pain medicine. Use proper lifting techniques. When you bend and lift, use positions that put less stress on your back: Bend your knees. Keep the load close to your body. Avoid twisting. Exercise regularly as told by your health care provider. Exercising helps your back heal faster and helps prevent back injuries by keeping muscles strong and flexible. Work with a physical therapist to make a safe exercise program, as recommended by your health care provider. Do any exercises as told by your physical therapist.  Lifestyle Maintain a healthy weight. Extra weight puts stress on your back and makes it difficult to have good posture. Avoid activities or situations that make you feel anxious or stressed. Stress and anxiety increase muscle tension and can make back pain worse. Learn ways to manage   anxiety and stress, such as through exercise. General instructions Sleep on a firm mattress in a comfortable position. Try lying on your side with your knees slightly bent. If you lie on your back, put a pillow under your knees. Follow your treatment plan as told by your health care provider. This may include: Cognitive or behavioral therapy. Acupuncture or massage therapy. Meditation or yoga. Contact a health care provider if: You have pain that is not  relieved with rest or medicine. You have increasing pain going down into your legs or buttocks. Your pain does not improve after 2 weeks. You have pain at night. You lose weight without trying. You have a fever or chills. Get help right away if: You develop new bowel or bladder control problems. You have unusual weakness or numbness in your arms or legs. You develop nausea or vomiting. You develop abdominal pain. You feel faint. Summary Acute back pain is sudden and usually short-lived. Use proper lifting techniques. When you bend and lift, use positions that put less stress on your back. Take over-the-counter and prescription medicines and apply heat or ice as directed by your health care provider. This information is not intended to replace advice given to you by your health care provider. Make sure you discuss any questions you have with your healthcare provider. Document Revised: 04/25/2020 Document Reviewed: 04/28/2020 [employer] Patient Education  2022 [employer]  

## 2021-02-25 ENCOUNTER — Encounter: Payer: Self-pay | Admitting: Family Medicine

## 2021-02-26 ENCOUNTER — Other Ambulatory Visit: Payer: Self-pay

## 2021-02-26 DIAGNOSIS — E785 Hyperlipidemia, unspecified: Secondary | ICD-10-CM

## 2021-02-26 DIAGNOSIS — E119 Type 2 diabetes mellitus without complications: Secondary | ICD-10-CM

## 2021-02-26 MED ORDER — METFORMIN HCL ER 500 MG PO TB24: 1000.0000 mg | ORAL_TABLET | Freq: Two times a day (BID) | ORAL | 1 refills | Status: DC

## 2021-02-26 MED ORDER — ATORVASTATIN CALCIUM 10 MG PO TABS: 10.0000 mg | ORAL_TABLET | Freq: Every day | ORAL | 3 refills | Status: DC

## 2021-04-18 ENCOUNTER — Telehealth: Payer: Self-pay | Admitting: Family Medicine

## 2021-04-18 NOTE — Telephone Encounter (Signed)
Pt. Called in and stated shes been having some issues this summer with her asthmatic symptoms and wanted to see if she could start back taking albuterol and get a refill for that medication

## 2021-04-19 ENCOUNTER — Encounter: Payer: Self-pay | Admitting: Family Medicine

## 2021-04-19 ENCOUNTER — Telehealth: Payer: PRIVATE HEALTH INSURANCE | Admitting: Family Medicine

## 2021-04-19 DIAGNOSIS — J069 Acute upper respiratory infection, unspecified: Secondary | ICD-10-CM

## 2021-04-19 MED ORDER — ALBUTEROL SULFATE HFA 108 (90 BASE) MCG/ACT IN AERS: 2.0000 | INHALATION_SPRAY | Freq: Four times a day (QID) | RESPIRATORY_TRACT | 0 refills | Status: DC | PRN

## 2021-04-19 MED ORDER — BENZONATATE 100 MG PO CAPS: 100.0000 mg | ORAL_CAPSULE | Freq: Two times a day (BID) | ORAL | 0 refills | Status: DC | PRN

## 2021-04-19 MED ORDER — FLUTICASONE PROPIONATE 50 MCG/ACT NA SUSP: 2.0000 | Freq: Every day | NASAL | 1 refills | Status: DC

## 2021-04-19 NOTE — Progress Notes (Signed)

## 2021-04-19 NOTE — Addendum Note (Signed)
Addended by: Freddy Finner on: 04/19/2021 10:02 AM   Modules accepted: Orders

## 2021-04-19 NOTE — Telephone Encounter (Signed)
She got an inhaler sent in today

## 2021-05-03 NOTE — Telephone Encounter (Signed)
error 

## 2021-05-31 ENCOUNTER — Other Ambulatory Visit: Payer: PRIVATE HEALTH INSURANCE

## 2021-08-29 ENCOUNTER — Other Ambulatory Visit: Payer: Self-pay | Admitting: Family Medicine

## 2022-02-01 ENCOUNTER — Other Ambulatory Visit (INDEPENDENT_AMBULATORY_CARE_PROVIDER_SITE_OTHER): Payer: PRIVATE HEALTH INSURANCE

## 2022-02-01 ENCOUNTER — Ambulatory Visit (INDEPENDENT_AMBULATORY_CARE_PROVIDER_SITE_OTHER): Payer: PRIVATE HEALTH INSURANCE | Admitting: Family

## 2022-02-01 ENCOUNTER — Encounter: Payer: Self-pay | Admitting: Family

## 2022-02-01 ENCOUNTER — Other Ambulatory Visit: Payer: Self-pay | Admitting: Family

## 2022-02-01 ENCOUNTER — Other Ambulatory Visit (HOSPITAL_BASED_OUTPATIENT_CLINIC_OR_DEPARTMENT_OTHER): Payer: Self-pay

## 2022-02-01 ENCOUNTER — Telehealth: Payer: Self-pay | Admitting: Family

## 2022-02-01 VITALS — BP 139/64 | HR 73 | Resp 18 | Ht 67.0 in | Wt 168.0 lb

## 2022-02-01 DIAGNOSIS — Z0000 Encounter for general adult medical examination without abnormal findings: Secondary | ICD-10-CM | POA: Diagnosis not present

## 2022-02-01 DIAGNOSIS — M5432 Sciatica, left side: Secondary | ICD-10-CM | POA: Diagnosis not present

## 2022-02-01 DIAGNOSIS — M5416 Radiculopathy, lumbar region: Secondary | ICD-10-CM | POA: Insufficient documentation

## 2022-02-01 DIAGNOSIS — E119 Type 2 diabetes mellitus without complications: Secondary | ICD-10-CM

## 2022-02-01 DIAGNOSIS — N950 Postmenopausal bleeding: Secondary | ICD-10-CM | POA: Diagnosis not present

## 2022-02-01 DIAGNOSIS — Z23 Encounter for immunization: Secondary | ICD-10-CM

## 2022-02-01 DIAGNOSIS — Z1231 Encounter for screening mammogram for malignant neoplasm of breast: Secondary | ICD-10-CM

## 2022-02-01 DIAGNOSIS — E785 Hyperlipidemia, unspecified: Secondary | ICD-10-CM | POA: Diagnosis not present

## 2022-02-01 DIAGNOSIS — Z1211 Encounter for screening for malignant neoplasm of colon: Secondary | ICD-10-CM | POA: Insufficient documentation

## 2022-02-01 LAB — COMPREHENSIVE METABOLIC PANEL
ALT: 16 U/L (ref 0–35)
AST: 15 U/L (ref 0–37)
Albumin: 4.4 g/dL (ref 3.5–5.2)
Alkaline Phosphatase: 62 U/L (ref 39–117)
BUN: 14 mg/dL (ref 6–23)
CO2: 29 mEq/L (ref 19–32)
Calcium: 9.7 mg/dL (ref 8.4–10.5)
Chloride: 104 mEq/L (ref 96–112)
Creatinine, Ser: 0.79 mg/dL (ref 0.40–1.20)
GFR: 82.61 mL/min (ref >60.00)
Glucose, Bld: 138 mg/dL — ABNORMAL HIGH (ref 70–99)
Potassium: 4.9 mEq/L (ref 3.5–5.1)
Sodium: 140 mEq/L (ref 135–145)
Total Bilirubin: 0.4 mg/dL (ref 0.2–1.2)
Total Protein: 6.8 g/dL (ref 6.0–8.3)

## 2022-02-01 LAB — LIPID PANEL
Cholesterol: 220 mg/dL — ABNORMAL HIGH (ref 0–200)
HDL: 56 mg/dL (ref >39.00)
LDL Cholesterol: 146 mg/dL — ABNORMAL HIGH (ref 0–99)
NonHDL: 164.47
Total CHOL/HDL Ratio: 4
Triglycerides: 94 mg/dL (ref 0.0–149.0)
VLDL: 18.8 mg/dL (ref 0.0–40.0)

## 2022-02-01 LAB — VITAMIN D 25 HYDROXY (VIT D DEFICIENCY, FRACTURES): VITD: 45.82 ng/mL (ref 30.00–100.00)

## 2022-02-01 LAB — HEMOGLOBIN A1C: Hgb A1c MFr Bld: 7.8 % — ABNORMAL HIGH (ref 4.6–6.5)

## 2022-02-01 LAB — MICROALBUMIN / CREATININE URINE RATIO
Creatinine,U: 73.8 mg/dL
Microalb Creat Ratio: 0.9 mg/g (ref 0.0–30.0)
Microalb, Ur: <0.7 mg/dL (ref 0.0–1.9)

## 2022-02-01 MED ORDER — ROSUVASTATIN CALCIUM 5 MG PO TABS
5.0000 mg | ORAL_TABLET | Freq: Every day | ORAL | 3 refills | Status: DC
  Filled 2022-02-01: qty 90, 90d supply, fill #0

## 2022-02-01 MED ORDER — METHYLPREDNISOLONE 4 MG PO TBPK: ORAL_TABLET | ORAL | 0 refills | Status: DC

## 2022-02-01 MED ORDER — METHYLPREDNISOLONE 4 MG PO TBPK
ORAL_TABLET | ORAL | 0 refills | Status: DC
  Filled 2022-02-01: qty 21, 6d supply, fill #0

## 2022-02-01 NOTE — Telephone Encounter (Signed)
Form faxed

## 2022-02-01 NOTE — Progress Notes (Signed)
Subjective:   By signing my name below, I, Jennifer Odom, attest that this documentation has been prepared under the direction and in the presence of [name], 02/01/2022   Patient ID: Jennifer Odom, female    DOB: Jun 05, 1966, 56 y.o.   MRN: [id number]  Chief Complaint  Patient presents with   Annual Exam    Doing well , minor complaints    HPI Patient is in today for a comprehensive physical exam.  Sciatica: She is complains of persistent sciatica symptoms. She states that pain is radiating down her left lower side down to her calf. She has been using Advil and Naproxen. She also tried to ice the area. Heat and standing helps relieve the symptoms.  Vaginal Bleeding: She reports that she has experienced some brown spotting. Symptoms appeared about twice in the past few months. She also reports that she has not menstruated in years. Blood Sugars: She reports that she sporadically checks her blood sugar levels. She also reports that in the morning, her blood sugar is around the 120's mg/dL. Lab Results  Component Value Date   HGBA1C 8.4 (H) 02/23/2021  Cholesterol: Her cholesterol levels are elevating. She states that she has heard bad things about statins. She is interested in being prescribed the lowest dosage of statin medication.  Lab Results  Component Value Date   CHOL 259 (H) 02/23/2021   HDL 52.40 02/23/2021   LDLCALC 183 (H) 02/23/2021   LDLDIRECT 163.9 01/23/2012   TRIG 120.0 02/23/2021   CHOLHDL 5 02/23/2021   She denies having any fever, ear pain, new muscle pain, joint pain, new moles, rashes, congestion, sinus pain, sore throat, palpations, wheezing, n/v/d, constipation, blood in stool, dysuria, frequency, hematuria, headaches, depresssion or anxiety at this time.  Social History: She reports no recent surgeries. She reports that her sister was endometrial cancer but states that she is now cancer-free.  Colonoscopy: She has not had a colonoscopy. She  reports that her mother was diagnosed with colon cancer which has caused the patient some anxiety.  Pap Smear: Last completed on 03/03/2019 Mammogram: Last completed on 08/19/2008 Immunizations: She has not received the Shingles vaccine. She is not interested in receiving the Shingles vaccine. She is overdue for the tetanus vaccine. She is interested in receiving the vaccine. She is not interested in receiving the HIV/HepC screening. She reports that she has received the first [employer] Covid-19 vaccine.  Diet: She is maintaining a healthy diet.  Dental: She is UTD on dental exams.  Vision: She is UTD on vision exams. She reports that her last eye exam was on 09/2021.   Health Maintenance Due  Topic Date Due   FOOT EXAM  Never done   OPHTHALMOLOGY EXAM  Never done   COLONOSCOPY (Pts 45-48yr Insurance coverage will need to be confirmed)  Never done   PAP SMEAR-Modifier  08/20/2011   MAMMOGRAM  12/05/2013   Zoster Vaccines- Shingrix (1 of 2) Never done   URINE MICROALBUMIN  06/04/2016   HEMOGLOBIN A1C  08/26/2021    Past Medical History:  Diagnosis Date   Chicken pox as a child   CTS (carpal tunnel syndrome) 01/23/2012   Diabetes mellitus 1994   gestational    Diabetes mellitus    Elevated BP    Esophageal reflux 05/18/2014   Fatigue 09/25/2011   GDM (gestational diabetes mellitus)    Hyperglycemia    Hyperlipidemia    Mumps as a child   Overweight(278.02)    Preventative health  care 09/25/2011    Past Surgical History:  Procedure Laterality Date   APPENDECTOMY     high school   ECTOPIC PREGNANCY SURGERY  2004   left side?    Family History  Problem Relation Age of Onset   Cancer Mother        colon   Diabetes Father        type 2   Heart disease Father        open heart surgery,triple bypass   Leukemia Father    Cancer Father        CLL   Stroke Father    Endometriosis Sister    Endometrial cancer Sister    Stroke Maternal Grandmother        X several    Hypertension Maternal Grandmother    Emphysema Maternal Grandfather        smoker   Heart attack Paternal Grandfather    Heart disease Paternal Grandfather        MI, sudden death   Cancer Maternal Uncle     Social History   Socioeconomic History   Marital status: Single    Spouse name: Not on file   Number of children: Not on file   Years of education: Not on file   Highest education level: Not on file  Occupational History   Not on file  Tobacco Use   Smoking status: Never   Smokeless tobacco: Never  Substance and Sexual Activity   Alcohol use: Yes    Comment: maybe once a month   Drug use: No   Sexual activity: Not on file  Other Topics Concern   Not on file  Social History Narrative   Works in Writer for the [employer]   1 son, grown lives in [place]   3 dogs   Enjoys going to her 37 in the [employer]   Social Determinants of Radio broadcast assistant Strain: Not on [employer] Insecurity: Not on file  Transportation Needs: Not on file  Physical Activity: Not on file  Stress: Not on file  Social Connections: Not on file  Intimate Partner Violence: Not on file    Outpatient Medications Prior to Visit  Medication Sig Dispense Refill   albuterol (VENTOLIN HFA) 108 (90 Base) MCG/ACT inhaler Inhale 2 puffs into the lungs every 6 (six) hours as needed for wheezing or shortness of breath. 8 g 0   fish oil-omega-3 fatty acids 1000 MG capsule Take 2 g by mouth daily.     glucose blood (FREESTYLE LITE) test strip 1 each by Other route as needed for other. 100 each 4   Krill Oil CAPS Take 1 capsule by mouth daily. 30 capsule 0   meloxicam (MOBIC) 7.5 MG tablet 1-2 po qd prn 30 tablet 2   metFORMIN (GLUCOPHAGE-XR) 500 MG 24 hr tablet TAKE 2 TABLETS BY MOUTH 2 TIMES DAILY WITH A MEAL. THEN 1 TAB AT NOON DUE TO SUGAR STILL ELEVATED 360 tablet 1   Multiple Vitamin (MULTIVITAMIN) tablet Take 1 tablet by mouth daily.     benzonatate (TESSALON) 100 MG capsule  Take 1 capsule (100 mg total) by mouth 2 (two) times daily as needed for cough. 20 capsule 0   fluticasone (FLONASE) 50 MCG/ACT nasal spray Place 2 sprays into both nostrils daily. 16 g 1   No facility-administered medications prior to visit.    Allergies  Allergen Reactions   Sulfa Antibiotics Hives    Review of Systems  Constitutional:  Negative for fever.  HENT:  Negative for congestion, ear pain, sinus pain and sore throat.   Respiratory:  Negative for wheezing.   Cardiovascular:  Negative for palpitations.  Gastrointestinal:  Negative for blood in stool, constipation, diarrhea, nausea and vomiting.  Genitourinary:  Negative for dysuria, frequency and hematuria.  Musculoskeletal:  Negative for joint pain and myalgias.  Skin:        (-) [place]  Neurological:  Negative for headaches.  Psychiatric/Behavioral:  Negative for depression. The patient is not nervous/anxious.        Objective:    Physical Exam Constitutional:      General: She is not in acute distress.    Appearance: Normal appearance. She is not ill-appearing.  HENT:     Head: Normocephalic and atraumatic.     Right Ear: Tympanic membrane, ear canal and external ear normal.     Left Ear: Tympanic membrane, ear canal and external ear normal.  Eyes:     Extraocular Movements: Extraocular movements intact.     Pupils: Pupils are equal, round, and reactive to light.  Cardiovascular:     Rate and Rhythm: Normal rate and regular rhythm.     Heart sounds: Normal heart sounds. No murmur heard.    No gallop.  Pulmonary:     Effort: Pulmonary effort is normal. No respiratory distress.     Breath sounds: Normal breath sounds. No wheezing or rales.  Abdominal:     General: Bowel sounds are normal. There is no distension.     Palpations: Abdomen is soft.     Tenderness: There is no abdominal tenderness. There is no guarding.  Musculoskeletal:     Comments: 5/5 strength in both upper and lower extremities   Skin:    General: Skin is warm and dry.  Neurological:     Mental Status: She is alert and oriented to person, place, and time.     Deep Tendon Reflexes:     Reflex Scores:      Patellar reflexes are 2+ on the right side and 2+ on the left side. Psychiatric:        Mood and Affect: Mood normal.        Behavior: Behavior normal.        Judgment: Judgment normal.     BP 139/64   Pulse 73   Resp 18   Ht _0  (1.702 m)   Wt 168 lb (76.2 kg)   SpO2 99%   BMI 26.31 kg/m  Wt Readings from Last 3 Encounters:  02/01/22 168 lb (76.2 kg)  02/23/21 176 lb 12.8 oz (80.2 kg)  10/13/18 177 lb (80.3 kg)       Assessment & Plan:   Problem List Items Addressed This Visit       Unprioritized   Type 2 diabetes mellitus without complication, without long-term current use of insulin (HCC)    Lab Results  Component Value Date   HGBA1C 8.4 (H) 02/23/2021   HGBA1C 8.3 (H) 09/24/2018   HGBA1C 8.3 (H) 01/22/2018   Lab Results  Component Value Date   MICROALBUR <0.7 06/05/2015   LDLCALC 183 (H) 02/23/2021   CREATININE 0.77 02/23/2021  Obtain A1C/urine microalbumin. Obatin copy of DM eye exam.Continue metformin.       Relevant Medications   rosuvastatin (CRESTOR) 5 MG tablet   Other Relevant Orders   Urine Microalbumin w/creat. ratio   Hemoglobin A1c   Comp Met (CMET)   Sciatica of left  side    New. Not responding to OTC nsaids.  Will rx with medrol pak.       Preventative health care - Primary    Refer for mammo/pap and colonoscopy.  Declines covid vaccine due to reaction as well as Shingrix today. She is willing to get Td.       Post-menopausal bleeding    New. Refer to GYN for evaluation.       Relevant Orders   Ambulatory referral to Obstetrics / Gynecology   Hyperlipidemia    Counseled on risk/benefit of statin. After long discussion she is willing to start a low dose statin. Will rx with crestor 7m once daily.       Relevant Medications   rosuvastatin (CRESTOR)  5 MG tablet   Other Relevant Orders   Lipid panel   Colon cancer screening   Relevant Orders   Ambulatory referral to Gastroenterology   Other Visit Diagnoses     Encounter for screening mammogram for malignant neoplasm of breast       Relevant Orders   MM 3D SCREEN BREAST BILATERAL   Need for Td vaccine       Relevant Orders   Td : Tetanus/diphtheria >7yo Preservative  free (Completed)        Meds ordered this encounter  Medications   DISCONTD: methylPREDNISolone (MEDROL DOSEPAK) 4 MG TBPK tablet    Sig: Take per package instructions    Dispense:  21 tablet    Refill:  0    Order Specific Question:   Supervising Provider    Answer:   BPenni HomansA [[id number]]   methylPREDNISolone (MEDROL DOSEPAK) 4 MG TBPK tablet    Sig: Take 6 tablets by mouth on day 1, then 5 tablets on day 2, then 4 tablets on day 3, then 3 tablets on day 4, then 2 tablets on day 5, then 1 tablet on day 6.    Dispense:  21 tablet    Refill:  0    Order Specific Question:   Supervising Provider    Answer:   BPenni HomansA [[id number]]   rosuvastatin (CRESTOR) 5 MG tablet    Sig: Take 1 tablet (5 mg total) by mouth daily.    Dispense:  90 tablet    Refill:  3    Order Specific Question:   Supervising Provider    Answer:   BPenni HomansA [[id number]]    I, MNance Pear NP, personally preformed the services described in this documentation.  All medical record entries made by the scribe were at my direction and in my presence.  I have reviewed the chart and discharge instructions (if applicable) and agree that the record reflects my personal performance and is accurate and complete. 02/01/2022  I,Amber Collins,acting as a sEducation administratorfor MNance Pear NP.,have documented all relevant documentation on the behalf of MNance Pear NP,as directed by  MNance Pear NP while in the presence of MNance Pear NP.  MNance Pear NP

## 2022-02-01 NOTE — Assessment & Plan Note (Signed)
Refer for mammo/pap and colonoscopy.  Declines covid vaccine due to reaction as well as Shingrix today. She is willing to get Td.

## 2022-02-01 NOTE — Telephone Encounter (Signed)
Please advise pt that sugar is improved but A1C is still above goal at 7.8.  I would recommend that she add actos 30mg  once daily. Rx pended. Continue work on diet/exercise.   Cholesterol is better than last year but still elevated. I would still recommend that she start the small dose of crestor once daily.

## 2022-02-01 NOTE — Assessment & Plan Note (Signed)
New. Not responding to OTC nsaids.  Will rx with medrol pak.

## 2022-02-01 NOTE — Assessment & Plan Note (Signed)
New. Refer to GYN for evaluation.

## 2022-02-01 NOTE — Assessment & Plan Note (Signed)
Counseled on risk/benefit of statin. After long discussion she is willing to start a low dose statin. Will rx with crestor 5mg  once daily.

## 2022-02-01 NOTE — Telephone Encounter (Signed)
Please call [facility] in [place] and request DM eye report. Please cancel medrol pak at [facility].

## 2022-02-01 NOTE — Assessment & Plan Note (Addendum)
Lab Results  Component Value Date   HGBA1C 8.4 (H) 02/23/2021   HGBA1C 8.3 (H) 09/24/2018   HGBA1C 8.3 (H) 01/22/2018   Lab Results  Component Value Date   MICROALBUR <0.7 06/05/2015   LDLCALC 183 (H) 02/23/2021   CREATININE 0.77 02/23/2021   Obtain A1C/urine microalbumin. Obatin copy of DM eye exam.Continue metformin.

## 2022-02-03 ENCOUNTER — Telehealth: Payer: PRIVATE HEALTH INSURANCE | Admitting: Emergency Medicine

## 2022-02-03 ENCOUNTER — Encounter: Payer: Self-pay | Admitting: Emergency Medicine

## 2022-02-03 DIAGNOSIS — R21 Rash and other nonspecific skin eruption: Secondary | ICD-10-CM | POA: Diagnosis not present

## 2022-02-03 DIAGNOSIS — S30860A Insect bite (nonvenomous) of lower back and pelvis, initial encounter: Secondary | ICD-10-CM

## 2022-02-03 DIAGNOSIS — W57XXXA Bitten or stung by nonvenomous insect and other nonvenomous arthropods, initial encounter: Secondary | ICD-10-CM | POA: Diagnosis not present

## 2022-02-03 MED ORDER — DOXYCYCLINE HYCLATE 100 MG PO TABS: 100.0000 mg | ORAL_TABLET | Freq: Two times a day (BID) | ORAL | 0 refills | Status: AC

## 2022-02-03 NOTE — Progress Notes (Signed)
E-Visit for Tick Bite  Thank you for describing your tick bite, Here is how we plan to help! Based on the information that you shared with me it looks like you have A tick bite that we will treat with a course of doxycycline.  In most cases a tick bite is painless and does not itch.  Most tick bites in which the tick is quickly removed do not require prescriptions. Ticks can transmit several diseases if they are infected and remain attacked to your skin. Therefore the length that the tick was attached and any symptoms you have experienced after the bite are import to accurately develop your custom treatment plan. In most cases a single dose of doxycycline may prevent the development of a more serious condition.  Based on your information I have Provided a home care guide for tick bites and  instructions on when to call for help. and Your symptoms indicate that you need a longer course of antibiotics and a follow up visit with a provider. I have sent doxycycline 100 mg twice a day for 14 days to the pharmacy that you selected. You will need to schedule a follow up visit with your provider. If you do not have a primary care provider you may use our telehealth physicians on the web at [facility]  Which ticks  are associated with illness?  The Wood Tick (dog tick) is the size of a watermelon seed and can sometimes transmit [facility] spotted fever and [place] tick fever.   The Deer Tick (black-legged tick) is between the size of a poppy seed (pin head) and an apple seed, and can sometimes transmit Lyme disease.  A brown to black tick with a white splotch on its back is likely a female Amblyomma americanum ([place] tick). This tick has been associated with [place] Tick Associated illness ( STARI)  Lyme disease has become the most common tick-borne illness in the [place]. The risk of Lyme disease following a recognized deer tick bite is estimated to be 1%.  The majority of cases  of Lyme disease start with a bull's eye rash at the site of the tick bite. The rash can occur days to weeks (typically 7-10 days) after a tick bite. Treatment with antibiotics is indicated if this rash appears. Flu-like symptoms may accompany the rash, including: fever, chills, headaches, muscle aches, and fatigue. Removing ticks promptly may prevent tick borne disease.  What can be used to prevent Tick Bites?  Insect repellant with at leas 20% DEET. Wearing long pants with sock and shoes. Avoiding tall grass and heavily wooded areas. Checking your skin after being outdoors. Shower with a washcloth after outdoor exposures.  HOME CARE ADVICE FOR TICK BITE  Wood Tick Removal:  Use a pair of tweezers and grasp the wood tick close to the skin (on its head). Pull the wood tick straight upward without twisting or crushing it. Maintain a steady pressure until it releases its grip.   If tweezers aren't available, use fingers, a loop of thread around the jaws, or a needle between the jaws for traction.  Note: covering the tick with petroleum jelly, nail polish or rubbing alcohol doesn't work. Neither does touching the tick with a hot or cold object. Tiny Deer Tick Removal:   Needs to be scraped off with a knife blade or credit card edge. Place tick in a sealed container (e.g. glass jar, zip lock plastic bag), in case your doctor wants to see it. Tick's Head  Removal:  If the wood tick's head breaks off in the skin, it must be removed. Clean the skin. Then use a sterile needle to uncover the head and lift it out or scrape it off.  If a very small piece of the head remains, the skin will eventually slough it off. Antibiotic Ointment:  Wash the wound and your hands with soap and water after removal to prevent catching any tick disease.  Apply an over the counter antibiotic ointment (e.g. bacitracin) to the bite once. Expected Course: Tick bites normally don't itch or hurt. That's why they often go  unnoticed. Call Your Doctor If:  You can't remove the tick or the tick's head Fever, a severe head ache, or rash occur in the next 2 weeks Bite begins to look infected Lyme's disease is common in your area You have not had a tetanus in the last 10 years Your current symptoms become worse    MAKE SURE YOU  Understand these instructions. Will watch your condition. Will get help right away if you are not doing well or get worse.    Thank you for choosing an e-visit.  Your e-visit answers were reviewed by a board certified advanced clinical practitioner to complete your personal care plan. Depending upon the condition, your plan could have included both over the counter or prescription medications.  Please review your pharmacy choice. Make sure the pharmacy is open so you can pick up prescription now. If there is a problem, you may contact your provider through [employer] and have the prescription routed to another pharmacy.  Your safety is important to [place]. If you have drug allergies check your prescription carefully.   For the next 24 hours you can use MyChart to ask questions about today's visit, request a non-urgent call back, or ask for a work or school excuse. You will get an email in the next two days asking about your experience. I hope that your e-visit has been valuable and will speed your recovery.

## 2022-02-03 NOTE — Progress Notes (Signed)
I have spent 5 minutes in review of e-visit questionnaire, review and updating patient chart, medical decision making and response to patient.   Shanley Furlough, PA-C    

## 2022-02-05 NOTE — Telephone Encounter (Signed)
Called but no answer, lvm for patient to call back 

## 2022-02-06 ENCOUNTER — Telehealth (HOSPITAL_BASED_OUTPATIENT_CLINIC_OR_DEPARTMENT_OTHER): Payer: Self-pay

## 2022-02-08 NOTE — Telephone Encounter (Signed)
Patient called and advised of results and provider's recommendations. She declines actos and crestor and will like to " continue to work on her diet and taking the metformin alone for diabetes"

## 2022-02-12 ENCOUNTER — Encounter: Payer: PRIVATE HEALTH INSURANCE | Admitting: Obstetrics & Gynecology

## 2022-02-21 ENCOUNTER — Encounter: Payer: PRIVATE HEALTH INSURANCE | Admitting: Obstetrics & Gynecology

## 2022-02-27 ENCOUNTER — Encounter: Payer: Self-pay | Admitting: Family Medicine

## 2022-02-27 ENCOUNTER — Telehealth: Payer: PRIVATE HEALTH INSURANCE | Admitting: Physician Assistant

## 2022-02-27 ENCOUNTER — Ambulatory Visit: Payer: PRIVATE HEALTH INSURANCE | Admitting: Internal Medicine

## 2022-02-27 DIAGNOSIS — M549 Dorsalgia, unspecified: Secondary | ICD-10-CM

## 2022-02-27 NOTE — Progress Notes (Signed)
Because of ongoing symptoms and concern for more persistent nerve irritation/compression despite rounds of treatment and PT, I feel your condition warrants further evaluation and I recommend that you be seen in a face to face visit. You either need to follow-up with your PCP for next steps -- imaging, referral, other treatments, or if symptoms are acutely worsening, be seen in-person at local urgent care or ER.    NOTE: There will be NO CHARGE for this eVisit   If you are having a true medical emergency please call 911.      For an urgent face to face visit, [place] has seven urgent care centers for your convenience:     [facility] Health Urgent Care Center at [facility] Directions [phone] [address] [place], [place] [address]    [facility] Health Urgent Care Center [facility]) Get Driving Directions [phone] [address] [place], [place] [address]  [facility] Health Urgent Care Center [facility]) Get Driving Directions [phone] [address] [place],  [place]  [address]  [facility] Health Urgent Care Center [facility] - at [employer] Directions  [phone] [address] [place],  [place] [address]   [facility] Health Urgent Care at [facility] Get Driving Directions [phone] [address] [place], [place] [address]   [facility] Health Urgent Care at [facility] Get Driving Directions  [phone] [address] [place], [place] [address]   [facility] Health Urgent Care at [facility] Directions [phone] [address], [address] [place], [place] [address]  Your MyChart E-visit questionnaire answers were reviewed by a board certified advanced clinical practitioner to complete your personal care plan based on your specific symptoms.  Thank you for using e-Visits.

## 2022-03-27 ENCOUNTER — Other Ambulatory Visit: Payer: Self-pay | Admitting: Physician Assistant

## 2022-03-27 DIAGNOSIS — M5416 Radiculopathy, lumbar region: Secondary | ICD-10-CM

## 2022-03-29 ENCOUNTER — Ambulatory Visit
Admission: RE | Admit: 2022-03-29 | Discharge: 2022-03-29 | Disposition: A | Discharge status: Home / Self Care | Payer: PRIVATE HEALTH INSURANCE | Source: Ambulatory Visit | Attending: Physician Assistant | Admitting: Physician Assistant

## 2022-03-29 DIAGNOSIS — M5416 Radiculopathy, lumbar region: Secondary | ICD-10-CM

## 2022-03-29 MED ORDER — IOPAMIDOL (ISOVUE-M 200) INJECTION 41%
1.0000 mL | Freq: Once | INTRAMUSCULAR | Status: AC
  Administered 2022-03-29: 1 mL via EPIDURAL

## 2022-03-29 MED ORDER — METHYLPREDNISOLONE ACETATE 40 MG/ML INJ SUSP (RADIOLOG
80.0000 mg | Freq: Once | INTRAMUSCULAR | Status: AC
  Administered 2022-03-29: 80 mg via EPIDURAL

## 2022-03-29 NOTE — Discharge Instructions (Signed)

## 2022-04-04 ENCOUNTER — Other Ambulatory Visit: Payer: Self-pay | Admitting: Orthopedic Surgery

## 2022-04-04 DIAGNOSIS — G8929 Other chronic pain: Secondary | ICD-10-CM

## 2022-04-12 ENCOUNTER — Ambulatory Visit
Admission: RE | Admit: 2022-04-12 | Discharge: 2022-04-12 | Disposition: A | Discharge status: Home / Self Care | Payer: PRIVATE HEALTH INSURANCE | Source: Ambulatory Visit | Attending: Orthopedic Surgery | Admitting: Orthopedic Surgery

## 2022-04-12 DIAGNOSIS — G8929 Other chronic pain: Secondary | ICD-10-CM

## 2022-04-12 MED ORDER — IOPAMIDOL (ISOVUE-M 200) INJECTION 41%
1.0000 mL | Freq: Once | INTRAMUSCULAR | Status: AC
  Administered 2022-04-12: 1 mL via EPIDURAL

## 2022-04-12 MED ORDER — METHYLPREDNISOLONE ACETATE 40 MG/ML INJ SUSP (RADIOLOG
80.0000 mg | Freq: Once | INTRAMUSCULAR | Status: AC
  Administered 2022-04-12: 80 mg via EPIDURAL

## 2022-04-12 NOTE — Discharge Instructions (Signed)

## 2022-04-30 ENCOUNTER — Telehealth: Payer: PRIVATE HEALTH INSURANCE | Admitting: Family Medicine

## 2022-04-30 ENCOUNTER — Encounter: Payer: Self-pay | Admitting: Family Medicine

## 2022-04-30 ENCOUNTER — Telehealth: Payer: Self-pay

## 2022-04-30 DIAGNOSIS — N950 Postmenopausal bleeding: Secondary | ICD-10-CM

## 2022-04-30 DIAGNOSIS — M5416 Radiculopathy, lumbar region: Secondary | ICD-10-CM | POA: Diagnosis not present

## 2022-04-30 DIAGNOSIS — E663 Overweight: Secondary | ICD-10-CM | POA: Diagnosis not present

## 2022-04-30 DIAGNOSIS — E119 Type 2 diabetes mellitus without complications: Secondary | ICD-10-CM

## 2022-04-30 NOTE — Progress Notes (Signed)
MyChart Video Visit    Virtual Visit via Video Note   This visit type was conducted due to national recommendations for restrictions regarding the COVID-19 Pandemic (e.g. social distancing) in an effort to limit this patient's exposure and mitigate transmission in our community. This patient is at least at moderate risk for complications without adequate follow up. This format is felt to be most appropriate for this patient at this time. Physical exam was limited by quality of the video and audio technology used for the visit. [name], CMA was able to get the patient set up on a video visit.  Patient location: Home Patient and provider in visit Provider location: Office  I discussed the limitations of evaluation and management by telemedicine and the availability of in person appointments. The patient expressed understanding and agreed to proceed.  Visit Date: 04/30/2022  Today's healthcare provider: Danise Edge, MD     Subjective:    Patient ID: Jennifer Odom, female    DOB: 17-Oct-1965, 56 y.o.   MRN: [id number]  Chief Complaint  Patient presents with   Procedure    Post  Op paperwork     HPI Patient is in today for evaluation of persistent and life altering low back pain with left sided radiculopathy. She has been struggling with persistent and worsening pain in her low back with radicular symptoms down left leg for about 5 months. No trauma or injury at start. No incontinence.  She has tried high dose oral steroids and other treatments. Has not gotten much relief. Gabapentin at 300 mg bid has helped some. This is largely being managed through [facility] Dr Shon Baton and MRI there has shown a bulging disc in low back with nerve impingement on left but she is unsure at what level, will request copy of MRI. Presently she is scheduled for a discectomy on October 20 but is unclear if she wants to proceed but due to her level of pain and debility her ADLs and work life are being  affected. Denies CP/palp/SOB/HA/congestion/fevers/GI or GU c/o. Taking meds as prescribed   Past Medical History:  Diagnosis Date   Chicken pox as a child   CTS (carpal tunnel syndrome) 01/23/2012   Diabetes mellitus 1994   gestational    Diabetes mellitus    Elevated BP    Esophageal reflux 05/18/2014   Fatigue 09/25/2011   GDM (gestational diabetes mellitus)    Hyperglycemia    Hyperlipidemia    Mumps as a child   Overweight(278.02)    Preventative health care 09/25/2011    Past Surgical History:  Procedure Laterality Date   APPENDECTOMY     high school   ECTOPIC PREGNANCY SURGERY  2004   left side?    Family History  Problem Relation Age of Onset   Cancer Mother        colon   Diabetes Father        type 2   Heart disease Father        open heart surgery,triple bypass   Leukemia Father    Cancer Father        CLL   Stroke Father    Endometriosis Sister    Endometrial cancer Sister    Stroke Maternal Grandmother        X several   Hypertension Maternal Grandmother    Emphysema Maternal Grandfather        smoker   Heart attack Paternal Grandfather    Heart disease Paternal Grandfather  MI, sudden death   Cancer Maternal Uncle     Social History   Socioeconomic History   Marital status: Single    Spouse name: Not on file   Number of children: Not on file   Years of education: Not on file   Highest education level: Not on file  Occupational History   Not on file  Tobacco Use   Smoking status: Never   Smokeless tobacco: Never  Substance and Sexual Activity   Alcohol use: Yes    Comment: maybe once a month   Drug use: No   Sexual activity: Not on file  Other Topics Concern   Not on file  Social History Narrative   Works in Writer for the [employer]   1 son, grown lives in [place]   3 dogs   Enjoys going to her Condo in the [place]   Social Determinants of Radio broadcast assistant Strain: Not on [employer] Insecurity: Not  on file  Transportation Needs: Not on file  Physical Activity: Not on file  Stress: Not on file  Social Connections: Not on file  Intimate Partner Violence: Not on file    Outpatient Medications Prior to Visit  Medication Sig Dispense Refill   albuterol (VENTOLIN HFA) 108 (90 Base) MCG/ACT inhaler Inhale 2 puffs into the lungs every 6 (six) hours as needed for wheezing or shortness of breath. 8 g 0   fish oil-omega-3 fatty acids 1000 MG capsule Take 2 g by mouth daily.     glucose blood (FREESTYLE LITE) test strip 1 each by Other route as needed for other. 100 each 4   Krill Oil CAPS Take 1 capsule by mouth daily. 30 capsule 0   meloxicam (MOBIC) 7.5 MG tablet 1-2 po qd prn 30 tablet 2   metFORMIN (GLUCOPHAGE-XR) 500 MG 24 hr tablet TAKE 2 TABLETS BY MOUTH 2 TIMES DAILY WITH A MEAL. THEN 1 TAB AT NOON DUE TO SUGAR STILL ELEVATED 360 tablet 1   methylPREDNISolone (MEDROL DOSEPAK) 4 MG TBPK tablet Take 6 tablets by mouth on day 1, then 5 tablets on day 2, then 4 tablets on day 3, then 3 tablets on day 4, then 2 tablets on day 5, then 1 tablet on day 6. 21 tablet 0   Multiple Vitamin (MULTIVITAMIN) tablet Take 1 tablet by mouth daily.     No facility-administered medications prior to visit.    Allergies  Allergen Reactions   Sulfa Antibiotics Hives    Review of Systems  Constitutional:  Positive for malaise/fatigue. Negative for fever.  HENT:  Negative for congestion.   Eyes:  Negative for blurred vision.  Respiratory:  Negative for shortness of breath.   Cardiovascular:  Negative for chest pain, palpitations and leg swelling.  Gastrointestinal:  Negative for abdominal pain, blood in stool and nausea.  Genitourinary:  Negative for dysuria, flank pain and frequency.  Musculoskeletal:  Positive for back pain, joint pain and myalgias. Negative for falls and neck pain.  Skin:  Negative for rash.  Neurological:  Negative for dizziness, loss of consciousness and headaches.   Endo/Heme/Allergies:  Negative for environmental allergies.  Psychiatric/Behavioral:  Negative for depression. The patient is not nervous/anxious.        Objective:    Physical Exam Constitutional:      General: She is not in acute distress.    Appearance: Normal appearance. She is not ill-appearing or toxic-appearing.  HENT:     Head: Normocephalic and  atraumatic.     Right Ear: External ear normal.     Left Ear: External ear normal.     Nose: Nose normal.  Eyes:     General:        Right eye: No discharge.        Left eye: No discharge.  Pulmonary:     Effort: Pulmonary effort is normal.  Skin:    Findings: No rash.  Neurological:     Mental Status: She is alert and oriented to person, place, and time.  Psychiatric:        Behavior: Behavior normal.     There were no vitals taken for this visit. Wt Readings from Last 3 Encounters:  02/01/22 168 lb (76.2 kg)  02/23/21 176 lb 12.8 oz (80.2 kg)  10/13/18 177 lb (80.3 kg)    Diabetic Foot Exam - Simple   No data filed    Lab Results  Component Value Date   WBC 8.3 02/23/2021   HGB 13.4 02/23/2021   HCT 40.1 02/23/2021   PLT 221.0 02/23/2021   GLUCOSE 138 (H) 02/01/2022   CHOL 220 (H) 02/01/2022   TRIG 94.0 02/01/2022   HDL 56.00 02/01/2022   LDLDIRECT 163.9 01/23/2012   LDLCALC 146 (H) 02/01/2022   ALT 16 02/01/2022   AST 15 02/01/2022   NA 140 02/01/2022   K 4.9 02/01/2022   CL 104 02/01/2022   CREATININE 0.79 02/01/2022   BUN 14 02/01/2022   CO2 29 02/01/2022   TSH 3.66 02/23/2021   HGBA1C 7.8 (H) 02/01/2022   MICROALBUR <0.7 02/01/2022    Lab Results  Component Value Date   TSH 3.66 02/23/2021   Lab Results  Component Value Date   WBC 8.3 02/23/2021   HGB 13.4 02/23/2021   HCT 40.1 02/23/2021   MCV 84.8 02/23/2021   PLT 221.0 02/23/2021   Lab Results  Component Value Date   NA 140 02/01/2022   K 4.9 02/01/2022   CO2 29 02/01/2022   GLUCOSE 138 (H) 02/01/2022   BUN 14 02/01/2022    CREATININE 0.79 02/01/2022   BILITOT 0.4 02/01/2022   ALKPHOS 62 02/01/2022   AST 15 02/01/2022   ALT 16 02/01/2022   PROT 6.8 02/01/2022   ALBUMIN 4.4 02/01/2022   CALCIUM 9.7 02/01/2022   GFR 82.61 02/01/2022   Lab Results  Component Value Date   CHOL 220 (H) 02/01/2022   Lab Results  Component Value Date   HDL 56.00 02/01/2022   Lab Results  Component Value Date   LDLCALC 146 (H) 02/01/2022   Lab Results  Component Value Date   TRIG 94.0 02/01/2022   Lab Results  Component Value Date   CHOLHDL 4 02/01/2022   Lab Results  Component Value Date   HGBA1C 7.8 (H) 02/01/2022       Assessment & Plan:   Problem List Items Addressed This Visit     Overweight    Has her weight down to 158 and has been working hard to keep it down      Type 2 diabetes mellitus without complication, without long-term current use of insulin (HCC)    Well controlled despite recent steroid use. Encouraged to minimize carbs especially when on steroids      Left lumbar radiculopathy - Primary    She has been struggling with persistent and worsening pain in her low back with radicular symptoms down left leg for about 5 months. No trauma or injury at start. No incontinence.  She has tried high dose oral steroids and other treatments. Has not gotten much relief. Gabapentin at 300 mg bid has helped some. This is largely being managed through [facility] Dr Rolena Infante and MRI there has shown a bulging disc in low back with nerve impingement on left but she is unsure at what level, will request copy of MRI. Presently she is scheduled for a discectomy on October 20 but is unclear if she wants to proceed but due to her level of pain and debility her ADLs and work life are being affected so she is willing to proceed if she cannot find any relief. We will refer to Sports Medicine for a consultation to make sure they do not feel she might find relief without surgical intervention. If she decides to proceed  with surgery she is medically stable and can be cleared for surgery but will require some blood work. For now add Lidocaine patches and gel at low back hip level and on her left ankle that is aching regularly. She can also try CBD daily and she will talk to Ortho about the possibility of increasing the Gabapentin since she is getting some relief and has no concerning side effects. Spent 45 minutes discussing current state and plan of care with patient.       Relevant Orders   Ambulatory referral to Sports Medicine   Post-menopausal bleeding    Has had roughly one episode a month for roughly 3 months. Minimal spotting for 1-2 days. Bright red. No urinary to GI symptoms, no trauma. Her 80 yo sister has just been diagnosed with endometrial cancer so patient agrees to referral to GYN for biopsy and further work up. Referral placed      Relevant Orders   Ambulatory referral to Obstetrics / Gynecology    I am having Azizi Lile maintain her multivitamin, fish oil-omega-3 fatty acids, Krill Oil, glucose blood, meloxicam, albuterol, metFORMIN, and methylPREDNISolone.  No orders of the defined types were placed in this encounter.   I discussed the assessment and treatment plan with the patient. The patient was provided an opportunity to ask questions and all were answered. The patient agreed with the plan and demonstrated an understanding of the instructions.   The patient was advised to call back or seek an in-person evaluation if the symptoms worsen or if the condition fails to improve as anticipated.    Penni Homans, MD [facility] at [facility] [phone] (phone) [phone] (fax)  [place]

## 2022-04-30 NOTE — Assessment & Plan Note (Signed)
Has had roughly one episode a month for roughly 3 months. Minimal spotting for 1-2 days. Bright red. No urinary to GI symptoms, no trauma. Her 58 yo sister has just been diagnosed with endometrial cancer so patient agrees to referral to GYN for biopsy and further work up. Referral placed

## 2022-04-30 NOTE — Telephone Encounter (Signed)
Called pt to get her setup for FU  Visit for Jan or Feb Lvm to call back to get setup

## 2022-04-30 NOTE — Assessment & Plan Note (Signed)
She has been struggling with persistent and worsening pain in her low back with radicular symptoms down left leg for about 5 months. No trauma or injury at start. No incontinence.  She has tried high dose oral steroids and other treatments. Has not gotten much relief. Gabapentin at 300 mg bid has helped some. This is largely being managed through [facility] Dr Shon Baton and MRI there has shown a bulging disc in low back with nerve impingement on left but she is unsure at what level, will request copy of MRI. Presently she is scheduled for a discectomy on October 20 but is unclear if she wants to proceed but due to her level of pain and debility her ADLs and work life are being affected so she is willing to proceed if she cannot find any relief. We will refer to Sports Medicine for a consultation to make sure they do not feel she might find relief without surgical intervention. If she decides to proceed with surgery she is medically stable and can be cleared for surgery but will require some blood work. For now add Lidocaine patches and gel at low back hip level and on her left ankle that is aching regularly. She can also try CBD daily and she will talk to Ortho about the possibility of increasing the Gabapentin since she is getting some relief and has no concerning side effects. Spent 45 minutes discussing current state and plan of care with patient.

## 2022-04-30 NOTE — Assessment & Plan Note (Signed)
Well controlled despite recent steroid use. Encouraged to minimize carbs especially when on steroids

## 2022-04-30 NOTE — Assessment & Plan Note (Signed)
Has her weight down to 158 and has been working hard to keep it down

## 2022-05-01 ENCOUNTER — Encounter: Payer: Self-pay | Admitting: General Practice

## 2022-05-02 ENCOUNTER — Ambulatory Visit: Payer: PRIVATE HEALTH INSURANCE | Admitting: Sports Medicine

## 2022-05-03 NOTE — Progress Notes (Unsigned)
   I, Philbert Riser, LAT, ATC acting as a scribe for Clementeen Graham, MD.  Subjective:    CC: Low back pain  HPI: Pt is a 56 y/o female c/o LBP ongoing since summer 2022. Pt has been seen previously at [facility]. Lumbar MRI, done at [facility], revealed L-sided L5-S1 disc herniation. Pt is really wanting to avoid spinal surgery. Pt locates pain to   Radiating pain: yes- into L leg LE numbness/tingling: LE weakness: Aggravates: Treatments tried: meloxicam, prednisone   Pertinent review of Systems: ***  Relevant historical information: ***   Objective:   There were no vitals filed for this visit. General: Well Developed, well nourished, and in no acute distress.   MSK: ***  Lab and Radiology Results No results found for this or any previous visit (from the past 72 hour(s)). No results found.    Impression and Recommendations:    Assessment and Plan: 56 y.o. female with ***.  PDMP not reviewed this encounter. No orders of the defined types were placed in this encounter.  No orders of the defined types were placed in this encounter.   Discussed warning signs or symptoms. Please see discharge instructions. Patient expresses understanding.   ***

## 2022-05-06 ENCOUNTER — Encounter: Payer: Self-pay | Admitting: Family Medicine

## 2022-05-06 ENCOUNTER — Ambulatory Visit (INDEPENDENT_AMBULATORY_CARE_PROVIDER_SITE_OTHER): Payer: PRIVATE HEALTH INSURANCE | Admitting: Family Medicine

## 2022-05-06 VITALS — BP 148/90 | HR 75 | Ht 67.0 in | Wt 167.0 lb

## 2022-05-06 DIAGNOSIS — M5432 Sciatica, left side: Secondary | ICD-10-CM | POA: Diagnosis not present

## 2022-05-06 NOTE — Patient Instructions (Signed)
Thank you for coming in today.   Please call [facility] at [phone] to schedule your spine injection.    Keep your surgical date with Dr Rolena Infante.   Get a 2nd opinion with [place] [facility].  Jennifer Odom is good as is Cytogeneticist  I fear you will pushed into surgery.

## 2022-06-17 ENCOUNTER — Encounter: Payer: PRIVATE HEALTH INSURANCE | Admitting: Obstetrics and Gynecology

## 2022-07-04 ENCOUNTER — Telehealth: Payer: PRIVATE HEALTH INSURANCE | Admitting: Family Medicine

## 2022-07-04 DIAGNOSIS — J069 Acute upper respiratory infection, unspecified: Secondary | ICD-10-CM

## 2022-07-04 MED ORDER — BENZONATATE 100 MG PO CAPS: 100.0000 mg | ORAL_CAPSULE | Freq: Two times a day (BID) | ORAL | 0 refills | Status: DC | PRN

## 2022-07-04 NOTE — Progress Notes (Signed)
We are sorry that you are not feeling well.  Here is how we plan to help!  Based on your presentation I believe you most likely have A cough due to a virus.  This is called viral bronchitis and is best treated by rest, plenty of fluids and control of the cough.  You may use Ibuprofen or Tylenol as directed to help your symptoms.     In addition you may use A non-prescription cough medication called Robitussin DAC. Take 2 teaspoons every 8 hours or Delsym: take 2 teaspoons every 12 hours. and A prescription cough medication called Tessalon Perles 100mg. You may take 1-2 capsules every 8 hours as needed for your cough.    From your responses in the eVisit questionnaire you describe inflammation in the upper respiratory tract which is causing a significant cough.  This is commonly called Bronchitis and has four common causes:   Allergies Viral Infections Acid Reflux Bacterial Infection Allergies, viruses and acid reflux are treated by controlling symptoms or eliminating the cause. An example might be a cough caused by taking certain blood pressure medications. You stop the cough by changing the medication. Another example might be a cough caused by acid reflux. Controlling the reflux helps control the cough.  USE OF BRONCHODILATOR ("RESCUE") INHALERS: There is a risk from using your bronchodilator too frequently.  The risk is that over-reliance on a medication which only relaxes the muscles surrounding the breathing tubes can reduce the effectiveness of medications prescribed to reduce swelling and congestion of the tubes themselves.  Although you feel brief relief from the bronchodilator inhaler, your asthma may actually be worsening with the tubes becoming more swollen and filled with mucus.  This can delay other crucial treatments, such as oral steroid medications. If you need to use a bronchodilator inhaler daily, several times per day, you should discuss this with your provider.  There are probably  better treatments that could be used to keep your asthma under control.     HOME CARE Only take medications as instructed by your medical team. Complete the entire course of an antibiotic. Drink plenty of fluids and get plenty of rest. Avoid close contacts especially the very young and the elderly Cover your mouth if you cough or cough into your sleeve. Always remember to wash your hands A steam or ultrasonic humidifier can help congestion.   GET HELP RIGHT AWAY IF: You develop worsening fever. You become short of breath You cough up blood. Your symptoms persist after you have completed your treatment plan MAKE SURE YOU  Understand these instructions. Will watch your condition. Will get help right away if you are not doing well or get worse.    Thank you for choosing an e-visit.  Your e-visit answers were reviewed by a board certified advanced clinical practitioner to complete your personal care plan. Depending upon the condition, your plan could have included both over the counter or prescription medications.  Please review your pharmacy choice. Make sure the pharmacy is open so you can pick up prescription now. If there is a problem, you may contact your provider through MyChart messaging and have the prescription routed to another pharmacy.  Your safety is important to us. If you have drug allergies check your prescription carefully.   For the next 24 hours you can use MyChart to ask questions about today's visit, request a non-urgent call back, or ask for a work or school excuse. You will get an email in the next two   days asking about your experience. I hope that your e-visit has been valuable and will speed your recovery.  I provided 5 minutes of non face-to-face time during this encounter for chart review, medication and order placement, as well as and documentation.   

## 2022-08-02 LAB — HM PAP SMEAR: HPV, high-risk: NEGATIVE

## 2022-09-22 ENCOUNTER — Other Ambulatory Visit: Payer: Self-pay | Admitting: Family Medicine

## 2022-10-16 ENCOUNTER — Telehealth: Payer: Managed Care, Other (non HMO) | Admitting: Nurse Practitioner

## 2022-10-16 DIAGNOSIS — J069 Acute upper respiratory infection, unspecified: Secondary | ICD-10-CM

## 2022-10-16 MED ORDER — BENZONATATE 100 MG PO CAPS: 100.0000 mg | ORAL_CAPSULE | Freq: Three times a day (TID) | ORAL | 0 refills | Status: DC | PRN

## 2022-10-16 MED ORDER — ALBUTEROL SULFATE HFA 108 (90 BASE) MCG/ACT IN AERS: 2.0000 | INHALATION_SPRAY | Freq: Four times a day (QID) | RESPIRATORY_TRACT | 0 refills | Status: DC | PRN

## 2022-10-16 MED ORDER — FLUTICASONE PROPIONATE 50 MCG/ACT NA SUSP: 2.0000 | Freq: Every day | NASAL | 6 refills | Status: DC

## 2022-10-16 NOTE — Progress Notes (Signed)

## 2022-10-16 NOTE — Addendum Note (Signed)
Addended by: Evelina Dun A on: 10/16/2022 04:25 PM   Modules accepted: Orders

## 2022-10-18 ENCOUNTER — Telehealth: Payer: Managed Care, Other (non HMO) | Admitting: Physician Assistant

## 2022-10-18 DIAGNOSIS — J019 Acute sinusitis, unspecified: Secondary | ICD-10-CM | POA: Diagnosis not present

## 2022-10-18 DIAGNOSIS — B9689 Other specified bacterial agents as the cause of diseases classified elsewhere: Secondary | ICD-10-CM

## 2022-10-18 DIAGNOSIS — J028 Acute pharyngitis due to other specified organisms: Secondary | ICD-10-CM

## 2022-10-18 MED ORDER — PREDNISONE 10 MG (21) PO TBPK: ORAL_TABLET | ORAL | 0 refills | Status: DC

## 2022-10-18 MED ORDER — LIDOCAINE VISCOUS HCL 2 % MT SOLN: OROMUCOSAL | 0 refills | Status: DC

## 2022-10-18 MED ORDER — AMOXICILLIN-POT CLAVULANATE 875-125 MG PO TABS: 1.0000 | ORAL_TABLET | Freq: Two times a day (BID) | ORAL | 0 refills | Status: DC

## 2022-10-18 NOTE — Progress Notes (Signed)
Virtual Visit Consent   Jennifer Odom, you are scheduled for a virtual visit with a [facility] provider today. Just as with appointments in the office, your consent must be obtained to participate. Your consent will be active for this visit and any virtual visit you may have with one of our providers in the next 365 days. If you have a MyChart account, a copy of this consent can be sent to you electronically.  As this is a virtual visit, video technology does not allow for your provider to perform a traditional examination. This may limit your provider's ability to fully assess your condition. If your provider identifies any concerns that need to be evaluated in person or the need to arrange testing (such as labs, EKG, etc.), we will make arrangements to do so. Although advances in technology are sophisticated, we cannot ensure that it will always work on either your end or our end. If the connection with a video visit is poor, the visit may have to be switched to a telephone visit. With either a video or telephone visit, we are not always able to ensure that we have a secure connection.  By engaging in this virtual visit, you consent to the provision of healthcare and authorize for your insurance to be billed (if applicable) for the services provided during this visit. Depending on your insurance coverage, you may receive a charge related to this service.  I need to obtain your verbal consent now. Are you willing to proceed with your visit today? Kaisley Belmonte has provided verbal consent on 10/18/2022 for a virtual visit (video or telephone). Mar Daring, PA-C  Date: 10/18/2022 9:05 AM  Virtual Visit via Video Note   I, Mar Daring, connected with  Lynita Buetow  ([id number], [birth year]) on 10/18/22 at  9:00 AM EST by a video-enabled telemedicine application and verified that I am speaking with the correct person using two identifiers.  Location: Patient: Virtual Visit  Location Patient: Home Provider: Virtual Visit Location Provider: Home Office   I discussed the limitations of evaluation and management by telemedicine and the availability of in person appointments. The patient expressed understanding and agreed to proceed.    History of Present Illness: Jennifer Odom is a 57 y.o. who identifies as a female who was assigned female at birth, and is being seen today for URI symptoms. Completed an EV on 10/16/22 and reports having worsening symptoms and needs a work note as well.  HPI: URI  This is a new problem. The current episode started in the past 7 days. The problem has been gradually worsening. There has been no fever. Associated symptoms include congestion, coughing, ear pain, headaches, rhinorrhea, sinus pain and a sore throat (has two white blisters in back of throat). Pertinent negatives include no diarrhea, nausea, plugged ear sensation or vomiting. Treatments tried: sudafed, tylenol cold and flu, tessalon perles, cough drops, tylenol and ibuprofen for ear pain. The treatment provided no relief.     Problems:  Patient Active Problem List   Diagnosis Date Noted   Left lumbar radiculopathy 02/01/2022   Post-menopausal bleeding 02/01/2022   Preventative health care 02/01/2022   Pelvic pain 09/27/2018   Stress headaches 09/22/2014   Esophageal reflux 05/18/2014   CTS (carpal tunnel syndrome) 01/23/2012   Colon cancer screening 09/25/2011   Fatigue 09/25/2011   Hyperlipidemia    Overweight    Type 2 diabetes mellitus without complication, without long-term current use of insulin (HCC)     Allergies:  Allergies  Allergen Reactions   Sulfa Antibiotics Hives   Medications:  Current Outpatient Medications:    albuterol (VENTOLIN HFA) 108 (90 Base) MCG/ACT inhaler, Inhale 2 puffs into the lungs every 6 (six) hours as needed for wheezing or shortness of breath., Disp: 8 g, Rfl: 0   amoxicillin-clavulanate (AUGMENTIN) 875-125 MG tablet, Take 1  tablet by mouth 2 (two) times daily., Disp: 20 tablet, Rfl: 0   benzonatate (TESSALON PERLES) 100 MG capsule, Take 1 capsule (100 mg total) by mouth 3 (three) times daily as needed., Disp: 20 capsule, Rfl: 0   fish oil-omega-3 fatty acids 1000 MG capsule, Take 2 g by mouth daily., Disp: , Rfl:    fluticasone (FLONASE) 50 MCG/ACT nasal spray, Place 2 sprays into both nostrils daily., Disp: 16 g, Rfl: 6   gabapentin (NEURONTIN) 300 MG capsule, Take 300 mg by mouth 3 (three) times daily., Disp: , Rfl:    glucose blood (FREESTYLE LITE) test strip, 1 each by Other route as needed for other., Disp: 100 each, Rfl: 4   Krill Oil CAPS, Take 1 capsule by mouth daily., Disp: 30 capsule, Rfl: 0   lidocaine (XYLOCAINE) 2 % solution, Swallow 78m every 6 hours as needed, Disp: 100 mL, Rfl: 0   metFORMIN (GLUCOPHAGE-XR) 500 MG 24 hr tablet, TAKE 2 TABLETS BY MOUTH 2 TIMES DAILY WITH A MEAL. THEN 1 TAB AT NOON DUE TO SUGAR STILL ELEVATED, Disp: 360 tablet, Rfl: 1   Multiple Vitamin (MULTIVITAMIN) tablet, Take 1 tablet by mouth daily., Disp: , Rfl:    predniSONE (STERAPRED UNI-PAK 21 TAB) 10 MG (21) TBPK tablet, 6 day taper; take as directed on package instructions, Disp: 21 tablet, Rfl: 0  Observations/Objective: Patient is well-developed, well-nourished in no acute distress.  Resting comfortably at home.  Head is normocephalic, atraumatic.  No labored breathing.  Speech is clear and coherent with logical content.  Patient is alert and oriented at baseline.  Dry light cough heard frequently not affecting speech  Assessment and Plan: 1. Acute bacterial sinusitis - amoxicillin-clavulanate (AUGMENTIN) 875-125 MG tablet; Take 1 tablet by mouth 2 (two) times daily.  Dispense: 20 tablet; Refill: 0 - predniSONE (STERAPRED UNI-PAK 21 TAB) 10 MG (21) TBPK tablet; 6 day taper; take as directed on package instructions  Dispense: 21 tablet; Refill: 0  2. Acute bacterial pharyngitis - amoxicillin-clavulanate  (AUGMENTIN) 875-125 MG tablet; Take 1 tablet by mouth 2 (two) times daily.  Dispense: 20 tablet; Refill: 0 - lidocaine (XYLOCAINE) 2 % solution; Swallow 534mevery 6 hours as needed  Dispense: 100 mL; Refill: 0 - predniSONE (STERAPRED UNI-PAK 21 TAB) 10 MG (21) TBPK tablet; 6 day taper; take as directed on package instructions  Dispense: 21 tablet; Refill: 0  - Worsening symptoms that have not responded to OTC medications.  - Will give Augmentin - Viscous lidocaine for sore throat - Prednisone for inflammation - Continue allergy medications.  - Steam and humidifier can help - Stay well hydrated and get plenty of rest.  - Seek in person evaluation if no symptom improvement or if symptoms worsen   Follow Up Instructions: I discussed the assessment and treatment plan with the patient. The patient was provided an opportunity to ask questions and all were answered. The patient agreed with the plan and demonstrated an understanding of the instructions.  A copy of instructions were sent to the patient via MyChart unless otherwise noted below.    The patient was advised to call back or seek an in-person  evaluation if the symptoms worsen or if the condition fails to improve as anticipated.  Time:  I spent 11 minutes with the patient via telehealth technology discussing the above problems/concerns.    Mar Daring, PA-C

## 2022-10-18 NOTE — Patient Instructions (Signed)
Jennifer Odom, thank you for joining Mar Daring, PA-C for today's virtual visit.  While this provider is not your primary care provider (PCP), if your PCP is located in our provider database this encounter information will be shared with them immediately following your visit.   [facility] account gives you access to today's visit and all your visits, tests, and labs performed at [facility] " click here if you don't have a [facility] account or go to [email or web address]  Consent: (Patient) Jennifer Odom provided verbal consent for this virtual visit at the beginning of the encounter.  Current Medications:  Current Outpatient Medications:    albuterol (VENTOLIN HFA) 108 (90 Base) MCG/ACT inhaler, Inhale 2 puffs into the lungs every 6 (six) hours as needed for wheezing or shortness of breath., Disp: 8 g, Rfl: 0   amoxicillin-clavulanate (AUGMENTIN) 875-125 MG tablet, Take 1 tablet by mouth 2 (two) times daily., Disp: 20 tablet, Rfl: 0   benzonatate (TESSALON PERLES) 100 MG capsule, Take 1 capsule (100 mg total) by mouth 3 (three) times daily as needed., Disp: 20 capsule, Rfl: 0   fish oil-omega-3 fatty acids 1000 MG capsule, Take 2 g by mouth daily., Disp: , Rfl:    fluticasone (FLONASE) 50 MCG/ACT nasal spray, Place 2 sprays into both nostrils daily., Disp: 16 g, Rfl: 6   gabapentin (NEURONTIN) 300 MG capsule, Take 300 mg by mouth 3 (three) times daily., Disp: , Rfl:    glucose blood (FREESTYLE LITE) test strip, 1 each by Other route as needed for other., Disp: 100 each, Rfl: 4   Krill Oil CAPS, Take 1 capsule by mouth daily., Disp: 30 capsule, Rfl: 0   lidocaine (XYLOCAINE) 2 % solution, Swallow 67m every 6 hours as needed, Disp: 100 mL, Rfl: 0   metFORMIN (GLUCOPHAGE-XR) 500 MG 24 hr tablet, TAKE 2 TABLETS BY MOUTH 2 TIMES DAILY WITH A MEAL. THEN 1 TAB AT NOON DUE TO SUGAR STILL ELEVATED, Disp: 360 tablet, Rfl: 1   Multiple Vitamin  (MULTIVITAMIN) tablet, Take 1 tablet by mouth daily., Disp: , Rfl:    predniSONE (STERAPRED UNI-PAK 21 TAB) 10 MG (21) TBPK tablet, 6 day taper; take as directed on package instructions, Disp: 21 tablet, Rfl: 0   Medications ordered in this encounter:  Meds ordered this encounter  Medications   amoxicillin-clavulanate (AUGMENTIN) 875-125 MG tablet    Sig: Take 1 tablet by mouth 2 (two) times daily.    Dispense:  20 tablet    Refill:  0    Order Specific Question:   Supervising Provider    Answer:   LChase [id number]  lidocaine (XYLOCAINE) 2 % solution    Sig: Swallow 552mevery 6 hours as needed    Dispense:  100 mL    Refill:  0    Order Specific Question:   Supervising Provider    Answer:   LAChase [id number] predniSONE (STERAPRED UNI-PAK 21 TAB) 10 MG (21) TBPK tablet    Sig: 6 day taper; take as directed on package instructions    Dispense:  21 tablet    Refill:  0    Order Specific Question:   Supervising Provider    Answer:   LAChase [id number]   *If you need refills on other medications prior to your next appointment, please contact your pharmacy*  Follow-Up: Call back or seek an in-person evaluation if the symptoms worsen or if the condition  fails to improve as anticipated.  [place] [phone]  Other Instructions  Pharyngitis  Pharyngitis is inflammation of the throat (pharynx). It is a very common cause of sore throat. Pharyngitis can be caused by a bacteria, but it is usually caused by a virus. Most cases of pharyngitis get better on their own without treatment. What are the causes? This condition may be caused by: Infection by viruses (viral). Viral pharyngitis spreads easily from person to person (is contagious) through coughing, sneezing, and sharing of personal items or utensils such as cups, forks, spoons, and toothbrushes. Infection by bacteria (bacterial). Bacterial pharyngitis may be spread by touching the  nose or face after coming in contact with the bacteria, or through close contact, such as kissing. Allergies. Allergies can cause buildup of mucus in the throat (post-nasal drip), leading to inflammation and irritation. Allergies can also cause blocked nasal passages, forcing breathing through the mouth, which dries and irritates the throat. What increases the risk? You are more likely to develop this condition if: You are 11-60 years old. You are exposed to crowded environments such as daycare, school, or dormitory living. You live in a cold climate. You have a weakened disease-fighting (immune) system. What are the signs or symptoms? Symptoms of this condition vary by the cause. Common symptoms of this condition include: Sore throat. Fatigue. Low-grade fever. Stuffy nose (nasal congestion) and cough. Headache. Other symptoms may include: Glands in the neck (lymph nodes) that are swollen. Skin rashes. Plaque-like film on the throat or tonsils. This is often a symptom of bacterial pharyngitis. Vomiting. Red, itchy eyes (conjunctivitis). Loss of appetite. Joint pain and muscle aches. Enlarged tonsils. How is this diagnosed? This condition may be diagnosed based on your medical history and a physical exam. Your health care provider will ask you questions about your illness and your symptoms. A swab of your throat may be done to check for bacteria (rapid strep test). Other lab tests may also be done, depending on the suspected cause, but these are rare. How is this treated? Many times, treatment is not needed for this condition. Pharyngitis usually gets better in 3-4 days without treatment. Bacterial pharyngitis may be treated with antibiotic medicines. Follow these instructions at home: Medicines Take over-the-counter and prescription medicines only as told by your health care provider. If you were prescribed an antibiotic medicine, take it as told by your health care provider. Do not  stop taking the antibiotic even if you start to feel better. Use throat sprays to soothe your throat as told by your health care provider. Children can get pharyngitis. Do not give your child aspirin because of the association with Reye's syndrome. Managing pain To help with pain, try: Sipping warm liquids, such as broth, herbal tea, or warm water. Eating or drinking cold or frozen liquids, such as frozen ice pops. Gargling with a mixture of salt and water 3-4 times a day or as needed. To make salt water, completely dissolve -1 tsp (3-6 g) of salt in 1 cup (237 mL) of warm water. Sucking on hard candy or throat lozenges. Putting a cool-mist humidifier in your bedroom at night to moisten the air. Sitting in the bathroom with the door closed for 5-10 minutes while you run hot water in the shower.  General instructions  Do not use any products that contain nicotine or tobacco. These products include cigarettes, chewing tobacco, and vaping devices, such as e-cigarettes. If you need help quitting, ask your health care provider.  Rest as told by your health care provider. Drink enough fluid to keep your urine pale yellow. How is this prevented? To help prevent becoming infected or spreading infection: Wash your hands often with soap and water for at least 20 seconds. If soap and water are not available, use hand sanitizer. Do not touch your eyes, nose, or mouth with unwashed hands, and wash hands after touching these areas. Do not share cups or eating utensils. Avoid close contact with people who are sick. Contact a health care provider if: You have large, tender lumps in your neck. You have a rash. You cough up green, yellow-brown, or bloody mucus. Get help right away if: Your neck becomes stiff. You drool or are unable to swallow liquids. You cannot drink or take medicines without vomiting. You have severe pain that does not go away, even after you take medicine. You have trouble  breathing, and it is not caused by a stuffy nose. You have new pain and swelling in your joints such as the knees, ankles, wrists, or elbows. These symptoms may represent a serious problem that is an emergency. Do not wait to see if the symptoms will go away. Get medical help right away. Call your local emergency services (911 in the [place] Do not drive yourself to the hospital. Summary Pharyngitis is redness, pain, and swelling (inflammation) of the throat (pharynx). While pharyngitis can be caused by a bacteria, the most common causes are viral. Most cases of pharyngitis get better on their own without treatment. Bacterial pharyngitis is treated with antibiotic medicines. This information is not intended to replace advice given to you by your health care provider. Make sure you discuss any questions you have with your health care provider. Document Revised: 11/01/2020 Document Reviewed: 11/01/2020 Elsevier Patient Education  [place].   Sinus Infection, Adult A sinus infection, also called sinusitis, is inflammation of your sinuses. Sinuses are hollow spaces in the bones around your face. Your sinuses are located: Around your eyes. In the middle of your forehead. Behind your nose. In your cheekbones. Mucus normally drains out of your sinuses. When your nasal tissues become inflamed or swollen, mucus can become trapped or blocked. This allows bacteria, viruses, and fungi to grow, which leads to infection. Most infections of the sinuses are caused by a virus. A sinus infection can develop quickly. It can last for up to 4 weeks (acute) or for more than 12 weeks (chronic). A sinus infection often develops after a cold. What are the causes? This condition is caused by anything that creates swelling in the sinuses or stops mucus from draining. This includes: Allergies. Asthma. Infection from bacteria or viruses. Deformities or blockages in your nose or sinuses. Abnormal growths in  the nose (nasal polyps). Pollutants, such as chemicals or irritants in the air. Infection from fungi. This is rare. What increases the risk? You are more likely to develop this condition if you: Have a weak body defense system (immune system). Do a lot of swimming or diving. Overuse nasal sprays. Smoke. What are the signs or symptoms? The main symptoms of this condition are pain and a feeling of pressure around the affected sinuses. Other symptoms include: Stuffy nose or congestion that makes it difficult to breathe through your nose. Thick yellow or greenish drainage from your nose. Tenderness, swelling, and warmth over the affected sinuses. A cough that may get worse at night. Decreased sense of smell and taste. Extra mucus that collects in the throat or the  back of the nose (postnasal drip) causing a sore throat or bad breath. Tiredness (fatigue). Fever. How is this diagnosed? This condition is diagnosed based on: Your symptoms. Your medical history. A physical exam. Tests to find out if your condition is acute or chronic. This may include: Checking your nose for nasal polyps. Viewing your sinuses using a device that has a light (endoscope). Testing for allergies or bacteria. Imaging tests, such as an MRI or CT scan. In rare cases, a bone biopsy may be done to rule out more serious types of fungal sinus disease. How is this treated? Treatment for a sinus infection depends on the cause and whether your condition is chronic or acute. If caused by a virus, your symptoms should go away on their own within 10 days. You may be given medicines to relieve symptoms. They include: Medicines that shrink swollen nasal passages (decongestants). A spray that eases inflammation of the nostrils (topical intranasal corticosteroids). Rinses that help get rid of thick mucus in your nose (nasal saline washes). Medicines that treat allergies (antihistamines). Over-the-counter pain relievers. If  caused by bacteria, your health care provider may recommend waiting to see if your symptoms improve. Most bacterial infections will get better without antibiotic medicine. You may be given antibiotics if you have: A severe infection. A weak immune system. If caused by narrow nasal passages or nasal polyps, surgery may be needed. Follow these instructions at home: Medicines Take, use, or apply over-the-counter and prescription medicines only as told by your health care provider. These may include nasal sprays. If you were prescribed an antibiotic medicine, take it as told by your health care provider. Do not stop taking the antibiotic even if you start to feel better. Hydrate and humidify  Drink enough fluid to keep your urine pale yellow. Staying hydrated will help to thin your mucus. Use a cool mist humidifier to keep the humidity level in your home above 50%. Inhale steam for 10-15 minutes, 3-4 times a day, or as told by your health care provider. You can do this in the bathroom while a hot shower is running. Limit your exposure to cool or dry air. Rest Rest as much as possible. Sleep with your head raised (elevated). Make sure you get enough sleep each night. General instructions  Apply a warm, moist washcloth to your face 3-4 times a day or as told by your health care provider. This will help with discomfort. Use nasal saline washes as often as told by your health care provider. Wash your hands often with soap and water to reduce your exposure to germs. If soap and water are not available, use hand sanitizer. Do not smoke. Avoid being around people who are smoking (secondhand smoke). Keep all follow-up visits. This is important. Contact a health care provider if: You have a fever. Your symptoms get worse. Your symptoms do not improve within 10 days. Get help right away if: You have a severe headache. You have persistent vomiting. You have severe pain or swelling around your face  or eyes. You have vision problems. You develop confusion. Your neck is stiff. You have trouble breathing. These symptoms may be an emergency. Get help right away. Call 911. Do not wait to see if the symptoms will go away. Do not drive yourself to the hospital. Summary A sinus infection is soreness and inflammation of your sinuses. Sinuses are hollow spaces in the bones around your face. This condition is caused by nasal tissues that become inflamed or swollen.  The swelling traps or blocks the flow of mucus. This allows bacteria, viruses, and fungi to grow, which leads to infection. If you were prescribed an antibiotic medicine, take it as told by your health care provider. Do not stop taking the antibiotic even if you start to feel better. Keep all follow-up visits. This is important. This information is not intended to replace advice given to you by your health care provider. Make sure you discuss any questions you have with your health care provider. Document Revised: 07/10/2021 Document Reviewed: 07/10/2021 Elsevier Patient Education  [place].    If you have been instructed to have an in-person evaluation today at a local Urgent Care facility, please use the link below. It will take you to a list of all of our available [facility], including address, phone number and hours of operation. Please do not delay care.  [facility]  If you or a family member do not have a primary care provider, use the link below to schedule a visit and establish care. When you choose a [facility] primary care physician or advanced practice provider, you gain a long-term partner in health. Find a Primary Care Provider  Learn more about [facility]'s in-office and virtual care options: [facility]

## 2022-11-03 ENCOUNTER — Other Ambulatory Visit: Payer: Self-pay | Admitting: Family

## 2023-01-01 ENCOUNTER — Telehealth: Payer: Self-pay | Admitting: Family

## 2023-01-01 ENCOUNTER — Ambulatory Visit (INDEPENDENT_AMBULATORY_CARE_PROVIDER_SITE_OTHER): Payer: Managed Care, Other (non HMO) | Admitting: Family

## 2023-01-01 ENCOUNTER — Encounter: Payer: Self-pay | Admitting: Family

## 2023-01-01 VITALS — BP 122/92 | HR 76 | Temp 98.2°F | Resp 18 | Ht 67.0 in | Wt 169.0 lb

## 2023-01-01 DIAGNOSIS — Z1211 Encounter for screening for malignant neoplasm of colon: Secondary | ICD-10-CM

## 2023-01-01 DIAGNOSIS — I10 Essential (primary) hypertension: Secondary | ICD-10-CM

## 2023-01-01 DIAGNOSIS — E119 Type 2 diabetes mellitus without complications: Secondary | ICD-10-CM | POA: Diagnosis not present

## 2023-01-01 DIAGNOSIS — Z7984 Long term (current) use of oral hypoglycemic drugs: Secondary | ICD-10-CM

## 2023-01-01 DIAGNOSIS — R5383 Other fatigue: Secondary | ICD-10-CM

## 2023-01-01 LAB — COMPREHENSIVE METABOLIC PANEL
ALT: 15 U/L (ref 0–35)
AST: 15 U/L (ref 0–37)
Albumin: 4.1 g/dL (ref 3.5–5.2)
Alkaline Phosphatase: 76 U/L (ref 39–117)
BUN: 13 mg/dL (ref 6–23)
CO2: 29 mEq/L (ref 19–32)
Calcium: 9.6 mg/dL (ref 8.4–10.5)
Chloride: 103 mEq/L (ref 96–112)
Creatinine, Ser: 0.75 mg/dL (ref 0.40–1.20)
GFR: 87.36 mL/min (ref >60.00)
Glucose, Bld: 196 mg/dL — ABNORMAL HIGH (ref 70–99)
Potassium: 4.6 mEq/L (ref 3.5–5.1)
Sodium: 139 mEq/L (ref 135–145)
Total Bilirubin: 0.4 mg/dL (ref 0.2–1.2)
Total Protein: 6.4 g/dL (ref 6.0–8.3)

## 2023-01-01 LAB — CBC WITH DIFFERENTIAL/PLATELET
Basophils Absolute: 0.1 10*3/uL (ref 0.0–0.1)
Basophils Relative: 1.3 % (ref 0.0–3.0)
Eosinophils Absolute: 0.7 10*3/uL (ref 0.0–0.7)
Eosinophils Relative: 10.6 % — ABNORMAL HIGH (ref 0.0–5.0)
HCT: 40.3 % (ref 36.0–46.0)
Hemoglobin: 13.4 g/dL (ref 12.0–15.0)
Lymphocytes Relative: 31.5 % (ref 12.0–46.0)
Lymphs Abs: 2 10*3/uL (ref 0.7–4.0)
MCHC: 33.2 g/dL (ref 30.0–36.0)
MCV: 83.9 fl (ref 78.0–100.0)
Monocytes Absolute: 0.7 10*3/uL (ref 0.1–1.0)
Monocytes Relative: 10.4 % (ref 3.0–12.0)
Neutro Abs: 3 10*3/uL (ref 1.4–7.7)
Neutrophils Relative %: 46.2 % (ref 43.0–77.0)
Platelets: 270 10*3/uL (ref 150.0–400.0)
RBC: 4.81 Mil/uL (ref 3.87–5.11)
RDW: 14.4 % (ref 11.5–15.5)
WBC: 6.4 10*3/uL (ref 4.0–10.5)

## 2023-01-01 LAB — TSH: TSH: 2.59 u[IU]/mL (ref 0.35–5.50)

## 2023-01-01 LAB — HEMOGLOBIN A1C: Hgb A1c MFr Bld: 8.4 % — ABNORMAL HIGH (ref 4.6–6.5)

## 2023-01-01 MED ORDER — FREESTYLE LIBRE 3 SENSOR MISC: 5 refills | Status: DC

## 2023-01-01 MED ORDER — LISINOPRIL 10 MG PO TABS: 10.0000 mg | ORAL_TABLET | Freq: Every day | ORAL | 3 refills | Status: DC

## 2023-01-01 MED ORDER — METFORMIN HCL ER 500 MG PO TB24: 1000.0000 mg | ORAL_TABLET | Freq: Two times a day (BID) | ORAL | 1 refills | Status: DC

## 2023-01-01 MED ORDER — SITAGLIPTIN PHOSPHATE 100 MG PO TABS: 100.0000 mg | ORAL_TABLET | Freq: Every day | ORAL | 5 refills | Status: DC

## 2023-01-01 NOTE — Telephone Encounter (Signed)
Electronic request sent for both 

## 2023-01-01 NOTE — Telephone Encounter (Signed)
Patient called stating she would like her medication for blood pressure cancelled since she does not believe she suffers of high blood pressure. She states that her bp was high due to her being here. Please advise.

## 2023-01-01 NOTE — Assessment & Plan Note (Signed)
Requesting rx for [employer].  Update A1C.

## 2023-01-01 NOTE — Telephone Encounter (Signed)
Please call [facility] and request copy of pap.

## 2023-01-01 NOTE — Assessment & Plan Note (Signed)
New.  She has had a great deal of stress which could definitely be a contributing factor. Will check general labs to evaluate for underlying medical cause.

## 2023-01-01 NOTE — Telephone Encounter (Signed)
Opened in error

## 2023-01-01 NOTE — Telephone Encounter (Signed)
See my chart message

## 2023-01-01 NOTE — Progress Notes (Signed)
Subjective:     Patient ID: Jennifer Odom, female    DOB: 12/18/1965, 57 y.o.   MRN: [id number]  Chief Complaint  Patient presents with   Fatigue    HPI Patient is in today to discuss fatigue.  First noticed a few months ago. She had microdiscectomy in October which was uneventful. Fatigue is worse in the PM.  Denies SOB/chest pain, no fever, no significant joint pain.   She notes a generally thinning of her hair for the last few months. She is taking biotin and notes that it has gotten better.   Her son has been going through some stressful things, planning wedding.  Step son diagnosed with cancer.  Has a stressful job.  She plans to do colonoscopy this summer.   BP has been consistently elevated.   BP Readings from Last 3 Encounters:  01/01/23 (!) 122/92  05/06/22 (!) 148/90  04/12/22 (!) 140/90   DM2-  Lab Results  Component Value Date   HGBA1C 7.8 (H) 02/01/2022   HGBA1C 8.4 (H) 02/23/2021   HGBA1C 8.3 (H) 09/24/2018   Lab Results  Component Value Date   MICROALBUR <0.7 02/01/2022   LDLCALC 146 (H) 02/01/2022   CREATININE 0.79 02/01/2022     Health Maintenance Due  Topic Date Due   COVID-19 Vaccine (1) Never done   FOOT EXAM  Never done   OPHTHALMOLOGY EXAM  Never done   COLONOSCOPY (Pts 45-75yrs Insurance coverage will need to be confirmed)  Never done   MAMMOGRAM  12/05/2013   Zoster Vaccines- Shingrix (1 of 2) Never done   PAP SMEAR-Modifier  03/02/2022   HEMOGLOBIN A1C  08/03/2022    Past Medical History:  Diagnosis Date   Chicken pox as a child   CTS (carpal tunnel syndrome) 01/23/2012   Diabetes mellitus 08/19/1992   gestational    Elevated BP    Esophageal reflux 05/18/2014   Fatigue 09/25/2011   GDM (gestational diabetes mellitus)    Hyperglycemia    Hyperlipidemia    Mumps as a child   Overweight(278.02)    Preventative health care 09/25/2011    Past Surgical History:  Procedure Laterality Date   APPENDECTOMY     high school    ECTOPIC PREGNANCY SURGERY  2004   left side?    Family History  Problem Relation Age of Onset   Cancer Mother        colon   Diabetes Father        type 2   Heart disease Father        open heart surgery,triple bypass   Leukemia Father    Cancer Father        CLL   Stroke Father    Endometriosis Sister    Endometrial cancer Sister    Stroke Maternal Grandmother        X several   Hypertension Maternal Grandmother    Emphysema Maternal Grandfather        smoker   Heart attack Paternal Grandfather    Heart disease Paternal Grandfather        MI, sudden death   Cancer Maternal Uncle     Social History   Socioeconomic History   Marital status: Single    Spouse name: Not on file   Number of children: Not on file   Years of education: Not on file   Highest education level: Master's degree (e.g., MA, MS, MEng, MEd, MSW, MBA)  Occupational History   Not on file  Tobacco Use   Smoking status: Never   Smokeless tobacco: Never  Substance and Sexual Activity   Alcohol use: Yes    Comment: maybe once a month   Drug use: No   Sexual activity: Not on file  Other Topics Concern   Not on file  Social History Narrative   Works in Engineer, manufacturing for the [employer]   1 son, grown lives in [place]   3 dogs   Enjoys going to her Condo in the [place]   Social Determinants of Health   Financial Resource Strain: Low Risk  (12/30/2022)   Overall Financial Resource Strain (CARDIA)    Difficulty of Paying Living Expenses: Not hard at all  Food Insecurity: No Food Insecurity (12/30/2022)   Hunger Vital Sign    Worried About Running Out of Food in the Last Year: Never true    Ran Out of Food in the Last Year: Never true  Transportation Needs: No Transportation Needs (12/30/2022)   PRAPARE - Administrator, Civil Service (Medical): No    Lack of Transportation (Non-Medical): No  Physical Activity: Insufficiently Active (12/30/2022)   Exercise Vital Sign    Days  of Exercise per Week: 3 days    Minutes of Exercise per Session: 40 min  Stress: No Stress Concern Present (12/30/2022)   [employer] of Occupational Health - Occupational Stress Questionnaire    Feeling of Stress : Not at all  Social Connections: Moderately Integrated (12/30/2022)   Social Connection and Isolation Panel [NHANES]    Frequency of Communication with Friends and Family: More than three times a week    Frequency of Social Gatherings with Friends and Family: Twice a week    Attends Religious Services: More than 4 times per year    Active Member of [employer] or Organizations: Yes    Attends Banker Meetings: 1 to 4 times per year    Marital Status: Divorced  Catering manager Violence: Not on file    Outpatient Medications Prior to Visit  Medication Sig Dispense Refill   fish oil-omega-3 fatty acids 1000 MG capsule Take 2 g by mouth daily.     glucose blood (FREESTYLE LITE) test strip 1 each by Other route as needed for other. 100 each 4   Krill Oil CAPS Take 1 capsule by mouth daily. 30 capsule 0   Multiple Vitamin (MULTIVITAMIN) tablet Take 1 tablet by mouth daily.     albuterol (VENTOLIN HFA) 108 (90 Base) MCG/ACT inhaler Inhale 2 puffs into the lungs every 6 (six) hours as needed for wheezing or shortness of breath. 8 g 0   amoxicillin-clavulanate (AUGMENTIN) 875-125 MG tablet Take 1 tablet by mouth 2 (two) times daily. 20 tablet 0   benzonatate (TESSALON PERLES) 100 MG capsule Take 1 capsule (100 mg total) by mouth 3 (three) times daily as needed. 20 capsule 0   fluticasone (FLONASE) 50 MCG/ACT nasal spray Place 2 sprays into both nostrils daily. 16 g 6   gabapentin (NEURONTIN) 300 MG capsule Take 300 mg by mouth 3 (three) times daily.     lidocaine (XYLOCAINE) 2 % solution Swallow 5mL every 6 hours as needed 100 mL 0   metFORMIN (GLUCOPHAGE-XR) 500 MG 24 hr tablet TAKE 2 TABLETS BY MOUTH 2 TIMES DAILY WITH A MEAL. THEN 1 TAB AT NOON DUE TO SUGAR STILL ELEVATED  360 tablet 1   predniSONE (STERAPRED UNI-PAK 21 TAB) 10 MG (21) TBPK tablet 6 day taper; take as  directed on package instructions 21 tablet 0   No facility-administered medications prior to visit.    Allergies  Allergen Reactions   Sulfa Antibiotics Hives    ROS See HPI    Objective:    Physical Exam Constitutional:      General: She is not in acute distress.    Appearance: Normal appearance. She is well-developed.  HENT:     Head: Normocephalic and atraumatic.     Right Ear: External ear normal.     Left Ear: External ear normal.  Eyes:     General: No scleral icterus. Neck:     Thyroid: No thyromegaly.  Cardiovascular:     Rate and Rhythm: Normal rate and regular rhythm.     Heart sounds: Normal heart sounds. No murmur heard. Pulmonary:     Effort: Pulmonary effort is normal. No respiratory distress.     Breath sounds: Normal breath sounds. No wheezing.  Musculoskeletal:     Cervical back: Neck supple.  Skin:    General: Skin is warm and dry.     Comments: Generalized mild hair thinning noted  Neurological:     Mental Status: She is alert and oriented to person, place, and time.  Psychiatric:        Mood and Affect: Mood normal.        Behavior: Behavior normal.        Thought Content: Thought content normal.        Judgment: Judgment normal.     BP (!) 122/92   Pulse 76   Temp 98.2 F (36.8 C)   Resp 18   Ht 5\' 7"  (1.702 m)   Wt 169 lb (76.7 kg)   SpO2 100%   BMI 26.47 kg/m  Wt Readings from Last 3 Encounters:  01/01/23 169 lb (76.7 kg)  05/06/22 167 lb (75.8 kg)  02/01/22 168 lb (76.2 kg)       Assessment & Plan:   Problem List Items Addressed This Visit       Unprioritized   Type 2 diabetes mellitus without complication, without long-term current use of insulin (HCC)    Requesting rx for [employer].  Update A1C.       Relevant Medications   metFORMIN (GLUCOPHAGE-XR) 500 MG 24 hr tablet   Continuous Glucose Sensor (FREESTYLE LIBRE  3 SENSOR) MISC   lisinopril (ZESTRIL) 10 MG tablet   Other Relevant Orders   HgB A1c   Screening for colon cancer   Relevant Orders   Ambulatory referral to Gastroenterology   Fatigue - Primary    New.  She has had a great deal of stress which could definitely be a contributing factor. Will check general labs to evaluate for underlying medical cause.       Relevant Orders   Comp Met (CMET)   CBC w/Diff   TSH   Other Visit Diagnoses     Primary hypertension       Relevant Medications   lisinopril (ZESTRIL) 10 MG tablet     Mom died from colon cancer in her 49's.  She understands the importance of following through with her colonoscopy.   I have discontinued Riham Vanroekel's gabapentin, fluticasone, benzonatate, albuterol, amoxicillin-clavulanate, lidocaine, and predniSONE. I have also changed her metFORMIN. Additionally, I am having her start on FreeStyle Libre 3 Sensor and lisinopril. Lastly, I am having her maintain her multivitamin, fish oil-omega-3 fatty acids, Krill Oil, and glucose blood.  Meds ordered this encounter  Medications   metFORMIN (  GLUCOPHAGE-XR) 500 MG 24 hr tablet    Sig: Take 2 tablets (1,000 mg total) by mouth 2 (two) times daily with a meal.    Dispense:  360 tablet    Refill:  1    Order Specific Question:   Supervising Provider    Answer:   Danise Edge A [[id number]]   Continuous Glucose Sensor (FREESTYLE LIBRE 3 SENSOR) MISC    Sig: Change sensor every 14 days.    Dispense:  2 each    Refill:  5    Order Specific Question:   Supervising Provider    Answer:   Danise Edge A [[id number]]   lisinopril (ZESTRIL) 10 MG tablet    Sig: Take 1 tablet (10 mg total) by mouth daily.    Dispense:  30 tablet    Refill:  3    Order Specific Question:   Supervising Provider    Answer:   Danise Edge A [[id number]]

## 2023-01-01 NOTE — Telephone Encounter (Signed)
Please call [facility] in [place] and request a copy of last DM eye report.

## 2023-01-01 NOTE — Telephone Encounter (Signed)
Please advise pt that after she left I reviewed her most recent blood pressures.  Including today, her last 3 readings have been high.   I would recommend that patient begin lisinopril 10mg  once daily.  This will help BP as well as protect her kidneys from damage due to diabetes.   Follow up in 1 week for nurse visit bp check and follow up bmet. Dx is HTN.

## 2023-01-22 ENCOUNTER — Emergency Department (HOSPITAL_BASED_OUTPATIENT_CLINIC_OR_DEPARTMENT_OTHER)
Admission: EM | Admit: 2023-01-22 | Discharge: 2023-01-22 | Disposition: A | Discharge status: Home / Self Care | Payer: Managed Care, Other (non HMO) | Attending: Emergency Medicine | Admitting: Emergency Medicine

## 2023-01-22 ENCOUNTER — Encounter (HOSPITAL_BASED_OUTPATIENT_CLINIC_OR_DEPARTMENT_OTHER): Payer: Self-pay | Admitting: Emergency Medicine

## 2023-01-22 ENCOUNTER — Other Ambulatory Visit: Payer: Self-pay

## 2023-01-22 ENCOUNTER — Telehealth: Payer: Self-pay | Admitting: Family Medicine

## 2023-01-22 ENCOUNTER — Other Ambulatory Visit (HOSPITAL_BASED_OUTPATIENT_CLINIC_OR_DEPARTMENT_OTHER): Payer: Self-pay

## 2023-01-22 DIAGNOSIS — E119 Type 2 diabetes mellitus without complications: Secondary | ICD-10-CM | POA: Insufficient documentation

## 2023-01-22 DIAGNOSIS — Z7984 Long term (current) use of oral hypoglycemic drugs: Secondary | ICD-10-CM | POA: Diagnosis not present

## 2023-01-22 DIAGNOSIS — I10 Essential (primary) hypertension: Secondary | ICD-10-CM | POA: Insufficient documentation

## 2023-01-22 DIAGNOSIS — R42 Dizziness and giddiness: Secondary | ICD-10-CM | POA: Insufficient documentation

## 2023-01-22 DIAGNOSIS — Z79899 Other long term (current) drug therapy: Secondary | ICD-10-CM | POA: Diagnosis not present

## 2023-01-22 MED ORDER — MECLIZINE HCL 25 MG PO TABS
25.0000 mg | ORAL_TABLET | Freq: Three times a day (TID) | ORAL | 0 refills | Status: DC | PRN
  Filled 2023-01-22: qty 30, 10d supply, fill #0

## 2023-01-22 NOTE — Discharge Instructions (Addendum)
1.  Schedule follow-up with your doctor for recheck within the next week. 2.  You may take meclizine for vertigo symptoms. 3.  Return to emergency department immediately if you have any associated visual problems, worsening or persisting vertigo, headache or other new concerning changes. 4.  At this time your intermittent numbness of fingertips sounds like cervical radiculopathy.  Reviewed this information and discuss a follow-up MRI with your doctor.

## 2023-01-22 NOTE — Telephone Encounter (Signed)
Initial Comment Caller states she is having some dizziness and feeling off balance. She checked her BP twice and its was 158/90 and 149/97. Translation No Nurse Assessment Nurse: Zena Amos, RN, Margaret Date/Time (Eastern Time): 01/22/2023 4:17:38 PM Confirm and document reason for call. If symptomatic, describe symptoms. ---Caller states an hour ago she began having dizziness, bg is fine at 117, Does the patient have any new or worsening symptoms? ---Yes Will a triage be completed? ---Yes Related visit to physician within the last 2 weeks? ---No Does the PT have any chronic conditions? (i.e. diabetes, asthma, this includes High risk factors for pregnancy, etc.) ---Yes List chronic conditions. ---diabetes Is this a behavioral health or substance abuse call? ---No Guidelines Guideline Title Affirmed Question Affirmed Notes Nurse Date/Time (Eastern Time) Dizziness - Lightheadedness SEVERE dizziness (e.g., unable to stand, requires support to walk, feels like passing out now) Zena Amos, Ceasar Mons 01/22/2023 4:18:26 PM Disp. Time Lamount Cohen Time) Disposition Final User 01/22/2023 4:21:54 PM Go to ED Now (or PCP triage) Yes Vassallo, RN, Beaulah Corin NOTE: All timestamps contained within this report are represented as [place] Standard Time. CONFIDENTIALTY NOTICE: This fax transmission is intended only for the addressee. It contains information that is legally privileged, confidential or otherwise protected from use or disclosure. If you are not the intended recipient, you are strictly prohibited from reviewing, disclosing, copying using or disseminating any of this information or taking any action in reliance on or regarding this information. If you have received this fax in error, please notify us immediately by telephone so that we can arrange for its return to [place]. Phone: [phone], Toll-Free: [phone], Fax: [phone] Page: 2 of 2 Call Id: [id number] Final  Disposition 01/22/2023 4:21:54 PM Go to ED Now (or PCP triage) Yes Zena Amos, RN, Ruel Favors Disagree/Comply Comply Caller Understands No PreDisposition Call Doctor Care Advice Given Per Guideline GO TO ED NOW (OR PCP TRIAGE): * IF NO PCP (PRIMARY CARE PROVIDER) SECOND-LEVEL TRIAGE: You need to be seen within the next hour. Go to the ED/UCC at _____________ Hospital. Leave as soon as you can. * It is better and safer if another adult drives instead of you. * Bring a list of your current medicines when you go to the Emergency Department (ER). CARE ADVICE given per Dizziness (Adult) guideline. Referrals [facility] - ED

## 2023-01-22 NOTE — ED Triage Notes (Signed)
Pt reports dizziness (off balance) today. Has eaten today. Reports home CBG has been normal. No n/v/d, chest pain, or shortness of breath.

## 2023-01-22 NOTE — ED Provider Notes (Signed)
[facility] Provider Note   CSN: [id number] Arrival date & time: 01/22/23  1638     History  Chief Complaint  Patient presents with   Dizziness    Jennifer Odom is a 57 y.o. female.  HPI Patient reports today at work she had a couple of episodes of feeling like she was unsteady and things were moving side-to-side.  She had been walking down the hall and suddenly felt that she needed to stop and support herself for balance and the perception of things shifting side-to-side.  No associated headache.  She reports after a few seconds she was able to walk to her desk and sit down.  Symptoms persisted a bit.  At that time she did have the nurse at work take her blood pressure.  Blood pressure readings were 158/90 and then 149/97.  She does have type 2 diabetes and checked blood sugar which was 117.  No focal weakness numbness or tingling of extremities association with the episode.  No vomiting.  Patient does report a history of awakening either during the night or the morning with numbness in her fingertips.  Sometimes does both hands at the same time other times it might be the right or the left.  She reports more often than not it is the third through fifth digits.  The symptoms really only occur when she is in bed either in the morning or in the middle the night.  She has no problems with weakness or numbness of the extremities for use.  This has been going on for a number of months.  Patient reports a history of vertigo.  About 5 years ago she reports she had a very intense episode which had persistent spinning quality even with her eyes closed and associated vomiting.  Ultimately, this responded to meclizine and resolved.  She did not have any further problems with vertigo after this time.  Patient reports that she was traveling in March and ended up getting a right ear infection.  She took antibiotics and it cleared.  She has not recently been  having any facial pain or ear pain.    Home Medications Prior to Admission medications   Medication Sig Start Date End Date Taking? Authorizing Provider  meclizine (ANTIVERT) 25 MG tablet Take 1 tablet (25 mg total) by mouth 3 (three) times daily as needed for dizziness. 01/22/23  Yes Sebastiano Luecke, Lebron Conners, MD  Continuous Glucose Sensor (FREESTYLE LIBRE 3 SENSOR) MISC Change sensor every 14 days. 01/01/23   Sandford Craze, NP  fish oil-omega-3 fatty acids 1000 MG capsule Take 2 g by mouth daily.    [provider]  glucose blood (FREESTYLE LITE) test strip 1 each by Other route as needed for other. 05/17/14   Bradd Canary, MD  [facility] [employer] CAPS Take 1 capsule by mouth daily. 10/01/11   Bradd Canary, MD  lisinopril (ZESTRIL) 10 MG tablet Take 1 tablet (10 mg total) by mouth daily. 01/01/23   Sandford Craze, NP  metFORMIN (GLUCOPHAGE-XR) 500 MG 24 hr tablet Take 2 tablets (1,000 mg total) by mouth 2 (two) times daily with a meal. 01/01/23   Sandford Craze, NP  Multiple Vitamin (MULTIVITAMIN) tablet Take 1 tablet by mouth daily.    [provider]  sitaGLIPtin (JANUVIA) 100 MG tablet Take 1 tablet (100 mg total) by mouth daily. 01/01/23   Sandford Craze, NP      Allergies    Sulfa antibiotics  Review of Systems   Review of Systems  Physical Exam Updated Vital Signs BP (!) 149/83   Pulse 81   Temp 97.7 F (36.5 C) (Oral)   Resp 18   Wt 73 kg   SpO2 98%   BMI 25.22 kg/m  Physical Exam Constitutional:      Comments: Well-nourished well-developed.  Clinically well in appearance.  HENT:     Head: Normocephalic and atraumatic.     Right Ear: Tympanic membrane, ear canal and external ear normal.     Left Ear: Tympanic membrane, ear canal and external ear normal.     Nose: Nose normal.     Mouth/Throat:     Mouth: Mucous membranes are moist.     Pharynx: Oropharynx is clear.  Eyes:     Extraocular Movements: Extraocular movements intact.      Conjunctiva/sclera: Conjunctivae normal.     Pupils: Pupils are equal, round, and reactive to light.  Cardiovascular:     Rate and Rhythm: Normal rate and regular rhythm.  Pulmonary:     Effort: Pulmonary effort is normal.     Breath sounds: Normal breath sounds.  Abdominal:     General: There is no distension.     Palpations: Abdomen is soft.     Tenderness: There is no abdominal tenderness. There is no guarding.  Musculoskeletal:        General: No swelling or tenderness. Normal range of motion.     Cervical back: Neck supple.     Right lower leg: No edema.     Left lower leg: No edema.  Skin:    General: Skin is warm and dry.  Neurological:     Comments: Cranial nerves II through XII intact.  Patient does have lateral nystagmus to the right.  Fairly mild but present.  Normal finger-nose exam bilaterally.  Upper extremity strength 5\5.  Normal heel-to-shin lower extremities.  Sensation intact to light touch x 4 extremities.  Psychiatric:        Mood and Affect: Mood normal.     ED Results / Procedures / Treatments   Labs (all labs ordered are listed, but only abnormal results are displayed) Labs Reviewed - No data to display  EKG None  Radiology No results found.  Procedures Procedures    Medications Ordered in ED Medications - No data to display  ED Course/ Medical Decision Making/ A&P                             Medical Decision Making  Patient presents as outlined.  She describes dizziness with vertiginous quality occurring today while standing.  Differential diagnosis includes cerebellar stroke\benign peripheral vertigo\orthostatic hypotension.  At this time with no associated headache, no associated neurologic symptoms, physical exam supportive of peripheral vertigo and prior history of peripheral vertigo with low risk factor profile for CVA, I have low suspicion for CVA as etiology.  At this time I do not feel the patient needs advanced imaging.  Pressures  were measured at the time of the event and were systolics 140s to 150s therefore low suspicion for orthostatic hypotension.  Blood pressures are not significantly elevated for hypertensive crisis and patient had no associated headache or visual change.  At this time plan will be for as needed meclizine.  Patient will keep a journal of blood pressures at home.  She reports she tends to have elevated blood pressure in any medical type setting.  We have reviewed strict return precautions for any strokelike symptoms.  Patient voices understanding.        Final Clinical Impression(s) / ED Diagnoses Final diagnoses:  Vertigo    Rx / DC Orders ED Discharge Orders          Ordered    meclizine (ANTIVERT) 25 MG tablet  3 times daily PRN        01/22/23 1731              Arby Barrette, MD 01/22/23 1751

## 2023-01-22 NOTE — Telephone Encounter (Signed)
Pt in ED.  

## 2023-01-22 NOTE — Telephone Encounter (Signed)
Pt stated her bp was 158/90 and 149/97 and was leaving lightheaded. Transferred to triage.

## 2023-01-23 NOTE — Telephone Encounter (Signed)
Pt has called again stating she waned to be seen today because she is still dizzy and experiencing high BP. Transferred to triage.  The office did not have anything available today

## 2023-01-24 ENCOUNTER — Ambulatory Visit (INDEPENDENT_AMBULATORY_CARE_PROVIDER_SITE_OTHER): Payer: Managed Care, Other (non HMO) | Admitting: Family

## 2023-01-24 VITALS — BP 122/78 | HR 91 | Temp 97.9°F | Resp 16 | Wt 161.0 lb

## 2023-01-24 DIAGNOSIS — H8111 Benign paroxysmal vertigo, right ear: Secondary | ICD-10-CM | POA: Diagnosis not present

## 2023-01-24 NOTE — Assessment & Plan Note (Addendum)
New.  Recommended that patient try the Epley Maneuver at home. She does not have time to commit to a formal PT Vestibular rehab program at home. Try adding claritin/flonase for a few weeks to see if this helps with fluid in ears.  When flying on an airplane recommended that she chew gum. Can continue meclizine as needed.

## 2023-01-24 NOTE — Telephone Encounter (Signed)
Appt today w/ Melissa.  

## 2023-01-24 NOTE — Progress Notes (Signed)
Subjective:   By signing my name below, I, Jennifer Odom, attest that this documentation has been prepared under the direction and in the presence of Jennifer Fillers, NP 01/24/23   Patient ID: Jennifer Odom, female    DOB: 17-Nov-1965, 57 y.o.   MRN: [id number]  Chief Complaint  Patient presents with   Follow-up    Here for ed follow up, diagnosed with vertigo    Ear Pain    Patient complains of right ear pain    HPI Patient is in today for a follow up.   Vertigo: She presented to the ED on 6/5 for episodes of dizziness. Has a history of peripheral vertigo. While in the ED there was low suspicion for orthostatic hypotension due to systolic readings in the 140s-150s. She has type 2 diabetes and blood sugar was 117. She was prescribed 25 mg Meclizine which helps but makes her drowsy.   She reports having an ear infection in March with intense pain and tenderness to touch on the outside of. She states it improved with antibiotics. In the last week she has experienced dizziness and vertigo, which she attributes to her past ear infection.   She reports her diastolic blood pressure has been in the low 80s without lisinopril.   Past Medical History:  Diagnosis Date   Chicken pox as a child   CTS (carpal tunnel syndrome) 01/23/2012   Diabetes mellitus 08/19/1992   gestational    Elevated BP    Esophageal reflux 05/18/2014   Fatigue 09/25/2011   GDM (gestational diabetes mellitus)    Hyperglycemia    Hyperlipidemia    Mumps as a child   Overweight(278.02)    Preventative health care 09/25/2011    Past Surgical History:  Procedure Laterality Date   APPENDECTOMY     high school   ECTOPIC PREGNANCY SURGERY  2004   left side?    Family History  Problem Relation Age of Onset   Cancer Mother        colon   Diabetes Father        type 2   Heart disease Father        open heart surgery,triple bypass   Leukemia Father    Cancer Father        CLL   Stroke Father     Endometriosis Sister    Endometrial cancer Sister    Stroke Maternal Grandmother        X several   Hypertension Maternal Grandmother    Emphysema Maternal Grandfather        smoker   Heart attack Paternal Grandfather    Heart disease Paternal Grandfather        MI, sudden death   Cancer Maternal Uncle     Social History   Socioeconomic History   Marital status: Single    Spouse name: Not on file   Number of children: Not on file   Years of education: Not on file   Highest education level: Master's degree (e.g., MA, MS, MEng, MEd, MSW, MBA)  Occupational History   Not on file  Tobacco Use   Smoking status: Never   Smokeless tobacco: Never  Substance and Sexual Activity   Alcohol use: Yes    Comment: maybe once a month   Drug use: No   Sexual activity: Not on file  Other Topics Concern   Not on file  Social History Narrative   Works in Engineer, manufacturing for the [employer]  1 son, grown lives in [place]   3 dogs   Enjoys going to her Condo in the [place]   Social Determinants of Health   Financial Resource Strain: Low Risk  (12/30/2022)   Overall Financial Resource Strain (CARDIA)    Difficulty of Paying Living Expenses: Not hard at all  Food Insecurity: No Food Insecurity (12/30/2022)   Hunger Vital Sign    Worried About Running Out of Food in the Last Year: Never true    Ran Out of Food in the Last Year: Never true  Transportation Needs: No Transportation Needs (12/30/2022)   PRAPARE - Administrator, Civil Service (Medical): No    Lack of Transportation (Non-Medical): No  Physical Activity: Insufficiently Active (12/30/2022)   Exercise Vital Sign    Days of Exercise per Week: 3 days    Minutes of Exercise per Session: 40 min  Stress: No Stress Concern Present (12/30/2022)   [employer] of Occupational Health - Occupational Stress Questionnaire    Feeling of Stress : Not at all  Social Connections: Moderately Integrated (12/30/2022)    Social Connection and Isolation Panel [NHANES]    Frequency of Communication with Friends and Family: More than three times a week    Frequency of Social Gatherings with Friends and Family: Twice a week    Attends Religious Services: More than 4 times per year    Active Member of [employer] or Organizations: Yes    Attends Banker Meetings: 1 to 4 times per year    Marital Status: Divorced  Catering manager Violence: Not on file    Outpatient Medications Prior to Visit  Medication Sig Dispense Refill   Continuous Glucose Sensor (FREESTYLE LIBRE 3 SENSOR) MISC Change sensor every 14 days. 2 each 5   fish oil-omega-3 fatty acids 1000 MG capsule Take 2 g by mouth daily.     glucose blood (FREESTYLE LITE) test strip 1 each by Other route as needed for other. 100 each 4   Krill Oil CAPS Take 1 capsule by mouth daily. 30 capsule 0   meclizine (ANTIVERT) 25 MG tablet Take 1 tablet (25 mg total) by mouth 3 (three) times daily as needed for dizziness. 30 tablet 0   metFORMIN (GLUCOPHAGE-XR) 500 MG 24 hr tablet Take 2 tablets (1,000 mg total) by mouth 2 (two) times daily with a meal. 360 tablet 1   Multiple Vitamin (MULTIVITAMIN) tablet Take 1 tablet by mouth daily.     sitaGLIPtin (JANUVIA) 100 MG tablet Take 1 tablet (100 mg total) by mouth daily. 30 tablet 5   lisinopril (ZESTRIL) 10 MG tablet Take 1 tablet (10 mg total) by mouth daily. (Patient not taking: Reported on 01/24/2023) 30 tablet 3   No facility-administered medications prior to visit.    Allergies  Allergen Reactions   Sulfa Antibiotics Hives    Review of Systems  Neurological:  Positive for dizziness.       Objective:    Physical Exam Constitutional:      General: She is not in acute distress.    Appearance: Normal appearance. She is well-developed.  HENT:     Head: Normocephalic and atraumatic.     Right Ear: Tympanic membrane, ear canal and external ear normal. Tympanic membrane is not bulging.     Left Ear:  Tympanic membrane, ear canal and external ear normal. Tympanic membrane is not bulging.     Ears:     Comments: Some clear fluid noted behind both TM's  Eyes:     General: No scleral icterus. Neck:     Thyroid: No thyromegaly.  Cardiovascular:     Rate and Rhythm: Normal rate and regular rhythm.     Heart sounds: Normal heart sounds. No murmur heard. Pulmonary:     Effort: Pulmonary effort is normal. No respiratory distress.     Breath sounds: Normal breath sounds. No wheezing.  Musculoskeletal:     Cervical back: Neck supple.  Skin:    General: Skin is warm and dry.  Neurological:     Mental Status: She is alert and oriented to person, place, and time.     Comments: Mildly positive Dix-Hallpike on right, negative left  Psychiatric:        Mood and Affect: Mood normal.        Behavior: Behavior normal.        Thought Content: Thought content normal.        Judgment: Judgment normal.     BP 122/78 (BP Location: Right Arm, Patient Position: Sitting, Cuff Size: Small)   Pulse 91   Temp 97.9 F (36.6 C) (Oral)   Resp 16   Wt 161 lb (73 kg)   SpO2 100%   BMI 25.22 kg/m  Wt Readings from Last 3 Encounters:  01/24/23 161 lb (73 kg)  01/22/23 161 lb (73 kg)  01/01/23 169 lb (76.7 kg)       Assessment & Plan:  Benign paroxysmal positional vertigo of right ear Assessment & Plan: New.  Recommended that patient try the Epley Maneuver at home. She does not have time to commit to a formal PT Vestibular rehab program at home. Try adding claritin/flonase for a few weeks to see if this helps with fluid in ears.  When flying on an airplane recommended that she chew gum. Can continue meclizine as needed.       I,Rachel Rivera,acting as a Neurosurgeon for Jennifer Fillers, NP.,have documented all relevant documentation on the behalf of Jennifer Fillers, NP,as directed by  Jennifer Fillers, NP while in the presence of Jennifer Fillers, NP.   I, Jennifer Fillers, NP,  personally preformed the services described in this documentation.  All medical record entries made by the scribe were at my direction and in my presence.  I have reviewed the chart and discharge instructions (if applicable) and agree that the record reflects my personal performance and is accurate and complete. 01/24/23   Jennifer Fillers, NP

## 2023-01-24 NOTE — Telephone Encounter (Signed)
Initial Comment Caller states she went to ED last night for high blood pressure, has appt tomorrow, 161/101 last check, has dull headache for couple of hours Translation No Nurse Assessment Nurse: Yetta Barre, RN, Miranda Date/Time (Eastern Time): 01/23/2023 1:41:42 PM Confirm and document reason for call. If symptomatic, describe symptoms. ---Caller states she was having dizziness last night and seen in ED. Diagnosed with vertigo. She is having dizziness, diarrhea, and her BP is elevated. She also has a headache. Her BP is 156/112 the first time she took it today. She saw PCP 1 month ago and was prescribed medication for HTN. She did not get the prescription filled because she did not think she needed it. She thought her BP was elevated at that time just because of being at the doctor's office. Does the patient have any new or worsening symptoms? ---Yes Will a triage be completed? ---Yes Related visit to physician within the last 2 weeks? ---Yes Does the PT have any chronic conditions? (i.e. diabetes, asthma, this includes High risk factors for pregnancy, etc.) ---Yes List chronic conditions. ---Diabetes Is this a behavioral health or substance abuse call? ---No Guidelines Guideline Title Affirmed Question Affirmed Notes Nurse Date/Time (Eastern Time) Headache [1] New headache AND [2] age > 45 Yetta Barre, RN, Miranda 01/23/2023 1:46:31 PM PLEASE NOTE: All timestamps contained within this report are represented as [place] Standard Time. CONFIDENTIALTY NOTICE: This fax transmission is intended only for the addressee. It contains information that is legally privileged, confidential or otherwise protected from use or disclosure. If you are not the intended recipient, you are strictly prohibited from reviewing, disclosing, copying using or disseminating any of this information or taking any action in reliance on or regarding this information. If you have received this fax in error, please notify us  immediately by telephone so that we can arrange for its return to [place]. Phone: [phone], Toll-Free: [phone], Fax: [phone] Page: 2 of 2 Call Id: [id number] Guidelines Guideline Title Affirmed Question Affirmed Notes Nurse Date/Time Lamount Cohen Time) Blood Pressure - High Systolic BP >= 180 OR Diastolic >= 110 Jones, RN, Miranda 01/23/2023 1:48:57 PM Disp. Time Lamount Cohen Time) Disposition Final User 01/23/2023 1:48:43 PM See PCP within 24 Hours Yetta Barre, RN, Tamera Punt 01/23/2023 1:54:49 PM See PCP within 24 Hours Yes Yetta Barre, RN, Miranda Final Disposition 01/23/2023 1:54:49 PM See PCP within 24 Hours Yes Yetta Barre, RN, Lenetta Quaker Disagree/Comply Comply Caller Understands Yes PreDisposition Did not know what to do Care Advice Given Per Guideline SEE PCP WITHIN 24 HOURS: * IF OFFICE WILL BE OPEN: You need to be examined within the next 24 hours. Call your doctor (or NP/PA) when the office opens and make an appointment. PAIN MEDICINES: * For pain relief, you can take either acetaminophen, ibuprofen, or naproxen. * They are over-the-counter (OTC) pain drugs. You can buy them at the drugstore. REST FOR HEADACHE: * Lie down in a dark quiet place and relax until feeling better. * Close your eyes and imagine your entire body relaxing. COLD PACK FOR HEADACHE: * Put a cold pack or an ice bag (wrapped in a moist towel) on the forehead * Do this for 20 minutes. CALL BACK IF: * Blurred vision or double vision occurs * Numbness or weakness of the face, arm or leg * Difficulty with speaking * You become worse CARE ADVICE given per Headache (Adult) guideline. SEE PCP WITHIN 24 HOURS: * IF OFFICE WILL BE OPEN: You need to be examined within the next 24 hours. Call your doctor (or NP/PA) when  the office opens and make an appointment. CALL BACK IF: * Weakness or numbness of the face, arm or leg on one side of the body occurs * Difficulty walking, difficulty talking, or severe headache occurs * Chest pain or  difficulty breathing occurs * You become worse CARE ADVICE given per High Blood Pressure (Adult) guideline. Comments User: Grier Rocher, RN Date/Time (Eastern Time): 01/23/2023 1:55:39 PM Pt is willing to start the medication prescribed last month if someone can call that in today. Then she will discuss is more at appt tomorrow. Referrals REFERRED TO PCP OFFICE

## 2023-01-27 ENCOUNTER — Encounter: Payer: Self-pay | Admitting: Family

## 2023-02-02 ENCOUNTER — Encounter: Payer: Self-pay | Admitting: Family

## 2023-04-03 ENCOUNTER — Encounter (INDEPENDENT_AMBULATORY_CARE_PROVIDER_SITE_OTHER): Payer: Self-pay

## 2023-05-04 NOTE — Assessment & Plan Note (Signed)
hgba1c acceptable, minimize simple carbs. Increase exercise as tolerated. Continue current meds

## 2023-05-04 NOTE — Assessment & Plan Note (Addendum)
Avoid offending foods, start probiotics. Do not eat large meals in late evening and consider raising head of bed.

## 2023-05-04 NOTE — Assessment & Plan Note (Signed)
Encouraged heart healthy diet, increase exercise, avoid trans fats, consider a krill oil cap daily

## 2023-05-04 NOTE — Assessment & Plan Note (Signed)
Encouraged DASH or MIND diet, decrease po intake and increase exercise as tolerated. Needs 7-8 hours of sleep nightly. Avoid trans fats, eat small, frequent meals every 4-5 hours with lean proteins, complex carbs and healthy fats. Minimize simple carbs, high fat foods and processed foods 

## 2023-05-05 ENCOUNTER — Ambulatory Visit: Payer: Managed Care, Other (non HMO) | Admitting: Family Medicine

## 2023-05-05 ENCOUNTER — Ambulatory Visit (INDEPENDENT_AMBULATORY_CARE_PROVIDER_SITE_OTHER): Payer: Managed Care, Other (non HMO) | Admitting: Family Medicine

## 2023-05-05 VITALS — BP 128/74 | HR 63 | Temp 97.7°F | Resp 16 | Ht 67.0 in | Wt 166.2 lb

## 2023-05-05 DIAGNOSIS — K219 Gastro-esophageal reflux disease without esophagitis: Secondary | ICD-10-CM

## 2023-05-05 DIAGNOSIS — E119 Type 2 diabetes mellitus without complications: Secondary | ICD-10-CM | POA: Diagnosis not present

## 2023-05-05 DIAGNOSIS — E785 Hyperlipidemia, unspecified: Secondary | ICD-10-CM

## 2023-05-05 DIAGNOSIS — E663 Overweight: Secondary | ICD-10-CM

## 2023-05-05 LAB — MICROALBUMIN / CREATININE URINE RATIO
Creatinine,U: 52.4 mg/dL
Microalb Creat Ratio: 2.1 mg/g (ref 0.0–30.0)
Microalb, Ur: 1.1 mg/dL (ref 0.0–1.9)

## 2023-05-05 LAB — COMPREHENSIVE METABOLIC PANEL
ALT: 16 U/L (ref 0–35)
AST: 16 U/L (ref 0–37)
Albumin: 4.3 g/dL (ref 3.5–5.2)
Alkaline Phosphatase: 70 U/L (ref 39–117)
BUN: 15 mg/dL (ref 6–23)
CO2: 27 meq/L (ref 19–32)
Calcium: 9.7 mg/dL (ref 8.4–10.5)
Chloride: 104 meq/L (ref 96–112)
Creatinine, Ser: 0.84 mg/dL (ref 0.40–1.20)
GFR: 76.07 mL/min (ref >60.00)
Glucose, Bld: 142 mg/dL — ABNORMAL HIGH (ref 70–99)
Potassium: 5.3 meq/L — ABNORMAL HIGH (ref 3.5–5.1)
Sodium: 139 meq/L (ref 135–145)
Total Bilirubin: 0.5 mg/dL (ref 0.2–1.2)
Total Protein: 6.6 g/dL (ref 6.0–8.3)

## 2023-05-05 LAB — LIPID PANEL
Cholesterol: 229 mg/dL — ABNORMAL HIGH (ref 0–200)
HDL: 57.9 mg/dL (ref >39.00)
LDL Cholesterol: 151 mg/dL — ABNORMAL HIGH (ref 0–99)
NonHDL: 171.39
Total CHOL/HDL Ratio: 4
Triglycerides: 102 mg/dL (ref 0.0–149.0)
VLDL: 20.4 mg/dL (ref 0.0–40.0)

## 2023-05-05 LAB — HEMOGLOBIN A1C: Hgb A1c MFr Bld: 7.7 % — ABNORMAL HIGH (ref 4.6–6.5)

## 2023-05-05 LAB — CBC WITH DIFFERENTIAL/PLATELET
Basophils Absolute: 0.1 10*3/uL (ref 0.0–0.1)
Basophils Relative: 1 % (ref 0.0–3.0)
Eosinophils Absolute: 0.2 10*3/uL (ref 0.0–0.7)
Eosinophils Relative: 3 % (ref 0.0–5.0)
HCT: 43.2 % (ref 36.0–46.0)
Hemoglobin: 13.7 g/dL (ref 12.0–15.0)
Lymphocytes Relative: 27.1 % (ref 12.0–46.0)
Lymphs Abs: 1.9 10*3/uL (ref 0.7–4.0)
MCHC: 31.7 g/dL (ref 30.0–36.0)
MCV: 84.8 fl (ref 78.0–100.0)
Monocytes Absolute: 0.8 10*3/uL (ref 0.1–1.0)
Monocytes Relative: 11.1 % (ref 3.0–12.0)
Neutro Abs: 4.1 10*3/uL (ref 1.4–7.7)
Neutrophils Relative %: 57.8 % (ref 43.0–77.0)
Platelets: 264 10*3/uL (ref 150.0–400.0)
RBC: 5.1 Mil/uL (ref 3.87–5.11)
RDW: 14.7 % (ref 11.5–15.5)
WBC: 7.1 10*3/uL (ref 4.0–10.5)

## 2023-05-05 LAB — TSH: TSH: 2.01 u[IU]/mL (ref 0.35–5.50)

## 2023-05-05 NOTE — Patient Instructions (Addendum)
[employer]  [employer]  Carbohydrate Counting for Diabetes Mellitus, Adult Carbohydrate counting is a method of keeping track of how many carbohydrates you eat. Eating carbohydrates increases the amount of sugar (glucose) in the blood. Counting how many carbohydrates you eat improves how well you manage your blood glucose. This, in turn, helps you manage your diabetes. Carbohydrates are measured in grams (g) per serving. It is important to know how many carbohydrates (in grams or by serving size) you can have in each meal. This is different for every person. A dietitian can help you make a meal plan and calculate how many carbohydrates you should have at each meal and snack. What foods contain carbohydrates? Carbohydrates are found in the following foods: Grains, such as breads and cereals. Dried beans and soy products. Starchy vegetables, such as potatoes, peas, and corn. Fruit and fruit juices. Milk and yogurt. Sweets and snack foods, such as cake, cookies, candy, chips, and soft drinks. How do I count carbohydrates in foods? There are two ways to count carbohydrates in food. You can read food labels or learn standard serving sizes of foods. You can use either of these methods or a combination of both. Using the Nutrition Facts label The Nutrition Facts list is included on the labels of almost all packaged foods and beverages in the [place]. It includes: The serving size. Information about nutrients in each serving, including the grams of carbohydrate per serving. To use the Nutrition Facts, decide how many servings you will have. Then, multiply the number of servings by the number of carbohydrates per serving. The resulting number is the total grams of carbohydrates that you will be having. Learning the standard serving sizes of foods When you eat carbohydrate foods that are not packaged or do not include Nutrition Facts on the label, you need to measure the servings in order to count  the grams of carbohydrates. Measure the foods that you will eat with a food scale or measuring cup, if needed. Decide how many standard-size servings you will eat. Multiply the number of servings by 15. For foods that contain carbohydrates, one serving equals 15 g of carbohydrates. For example, if you eat 2 cups or 10 oz (300 g) of strawberries, you will have eaten 2 servings and 30 g of carbohydrates (2 servings x 15 g = 30 g). For foods that have more than one food mixed, such as soups and casseroles, you must count the carbohydrates in each food that is included. The following list contains standard serving sizes of common carbohydrate-rich foods. Each of these servings has about 15 g of carbohydrates: 1 slice of bread. 1 six-inch (15 cm) tortilla. ? cup or 2 oz (53 g) cooked rice or pasta.  cup or 3 oz (85 g) cooked or canned, drained and rinsed beans or lentils.  cup or 3 oz (85 g) starchy vegetable, such as peas, corn, or squash.  cup or 4 oz (120 g) hot cereal.  cup or 3 oz (85 g) boiled or mashed potatoes, or  or 3 oz (85 g) of a large baked potato.  cup or 4 fl oz (118 mL) fruit juice. 1 cup or 8 fl oz (237 mL) milk. 1 small or 4 oz (106 g) apple.  or 2 oz (63 g) of a medium banana. 1 cup or 5 oz (150 g) strawberries. 3 cups or 1 oz (28.3 g) popped popcorn. What is an example of carbohydrate counting? To calculate the grams of carbohydrates in  this sample meal, follow the steps shown below. Sample meal 3 oz (85 g) chicken breast. ? cup or 4 oz (106 g) brown rice.  cup or 3 oz (85 g) corn. 1 cup or 8 fl oz (237 mL) milk. 1 cup or 5 oz (150 g) strawberries with sugar-free whipped topping. Carbohydrate calculation Identify the foods that contain carbohydrates: Rice. Corn. Milk. Strawberries. Calculate how many servings you have of each food: 2 servings rice. 1 serving corn. 1 serving milk. 1 serving strawberries. Multiply each number of servings by 15 g: 2  servings rice x 15 g = 30 g. 1 serving corn x 15 g = 15 g. 1 serving milk x 15 g = 15 g. 1 serving strawberries x 15 g = 15 g. Add together all of the amounts to find the total grams of carbohydrates eaten: 30 g + 15 g + 15 g + 15 g = 75 g of carbohydrates total. What are tips for following this plan? Shopping Develop a meal plan and then make a shopping list. Buy fresh and frozen vegetables, fresh and frozen fruit, dairy, eggs, beans, lentils, and whole grains. Look at food labels. Choose foods that have more fiber and less sugar. Avoid processed foods and foods with added sugars. Meal planning Aim to have the same number of grams of carbohydrates at each meal and for each snack time. Plan to have regular, balanced meals and snacks. Where to find more information American Diabetes Association: diabetes.org Centers for Disease Control and Prevention: Ton[employer].no Academy of Nutrition and Dietetics: eatright.org Association of Diabetes Care & Education Specialists: diabeteseducator.org Summary Carbohydrate counting is a method of keeping track of how many carbohydrates you eat. Eating carbohydrates increases the amount of sugar (glucose) in your blood. Counting how many carbohydrates you eat improves how well you manage your blood glucose. This helps you manage your diabetes. A dietitian can help you make a meal plan and calculate how many carbohydrates you should have at each meal and snack. This information is not intended to replace advice given to you by your health care provider. Make sure you discuss any questions you have with your health care provider. Document Revised: 03/08/2020 Document Reviewed: 03/08/2020 Elsevier Patient Education  2024 [employer].

## 2023-05-06 ENCOUNTER — Other Ambulatory Visit: Payer: Self-pay

## 2023-05-06 DIAGNOSIS — E119 Type 2 diabetes mellitus without complications: Secondary | ICD-10-CM

## 2023-05-07 ENCOUNTER — Encounter: Payer: Self-pay | Admitting: Family Medicine

## 2023-05-07 NOTE — Progress Notes (Signed)
Subjective:    Patient ID: Jennifer Odom, female    DOB: [birth year], 57 y.o.   MRN: [id number]  Chief Complaint  Patient presents with  . Follow-up    Follow up    HPI Discussed the use of AI scribe software for clinical note transcription with the patient, who gave verbal consent to proceed.  History of Present Illness   The patient, with a history of diabetes and recent ear issues, presents with concerns about hair loss, sleep disturbances, and stress management. She reports that her hair has been breaking and falling out, which she attributes to stress and possibly hormonal changes related to age. She also reports frequent awakenings at night and difficulty sleeping. The patient has been experiencing high levels of stress due to personal life events, which she believes may be contributing to her health issues.  In addition to these concerns, the patient mentions a recent ear issue that caused temporary hearing loss and pain. She had to seek urgent care and avoid air travel due to this issue. The patient reports that the ear issue has mostly resolved, but she still experiences occasional discomfort and hearing abnormalities.  The patient is actively managing her diabetes, monitoring her blood sugar levels, and making dietary changes. She reports a recent improvement in her blood sugar levels. She is also engaged in regular physical activity, walking between five and ten thousand steps a day.  The patient is taking several supplements, including berberine. She expresses interest in additional supplements that could help manage her health concerns.        Past Medical History:  Diagnosis Date  . Chicken pox as a child  . CTS (carpal tunnel syndrome) 01/23/2012  . Diabetes mellitus 08/19/1992   gestational   . Elevated BP   . Esophageal reflux 05/18/2014  . Fatigue 09/25/2011  . GDM (gestational diabetes mellitus)   . Hyperglycemia   . Hyperlipidemia   . Mumps as a child  .  Overweight(278.02)   . Preventative health care 09/25/2011    Past Surgical History:  Procedure Laterality Date  . APPENDECTOMY     high school  . ECTOPIC PREGNANCY SURGERY  2004   left side?    Family History  Problem Relation Age of Onset  . Cancer Mother        colon  . Diabetes Father        type 2  . Heart disease Father        open heart surgery,triple bypass  . Leukemia Father   . Cancer Father        CLL  . Stroke Father   . Endometriosis Sister   . Endometrial cancer Sister   . Stroke Maternal Grandmother        X several  . Hypertension Maternal Grandmother   . Emphysema Maternal Grandfather        smoker  . Heart attack Paternal Grandfather   . Heart disease Paternal Grandfather        MI, sudden death  . Cancer Maternal Uncle     Social History   Socioeconomic History  . Marital status: Single    Spouse name: Not on file  . Number of children: Not on file  . Years of education: Not on file  . Highest education level: Master's degree (e.g., MA, MS, MEng, MEd, MSW, MBA)  Occupational History  . Not on file  Tobacco Use  . Smoking status: Never  . Smokeless tobacco: Never  Substance and  Sexual Activity  . Alcohol use: Yes    Comment: maybe once a month  . Drug use: No  . Sexual activity: Not on file  Other Topics Concern  . Not on file  Social History Narrative   Works in Engineer, manufacturing for the [employer]   1 son, grown lives in [place]   3 dogs   Enjoys going to her Condo in the [place]   Social Determinants of Health   Financial Resource Strain: Low Risk  (12/30/2022)   Overall Financial Resource Strain (CARDIA)   . Difficulty of Paying Living Expenses: Not hard at all  Food Insecurity: No Food Insecurity (12/30/2022)   Hunger Vital Sign   . Worried About Programme researcher, broadcasting/film/video in the Last Year: Never true   . Ran Out of Food in the Last Year: Never true  Transportation Needs: No Transportation Needs (12/30/2022)   PRAPARE -  Transportation   . Lack of Transportation (Medical): No   . Lack of Transportation (Non-Medical): No  Physical Activity: Insufficiently Active (12/30/2022)   Exercise Vital Sign   . Days of Exercise per Week: 3 days   . Minutes of Exercise per Session: 40 min  Stress: No Stress Concern Present (12/30/2022)   [employer] of Occupational Health - Occupational Stress Questionnaire   . Feeling of Stress : Not at all  Social Connections: Moderately Integrated (12/30/2022)   Social Connection and Isolation Panel [NHANES]   . Frequency of Communication with Friends and Family: More than three times a week   . Frequency of Social Gatherings with Friends and Family: Twice a week   . Attends Religious Services: More than 4 times per year   . Active Member of Clubs or Organizations: Yes   . Attends Banker Meetings: 1 to 4 times per year   . Marital Status: Divorced  Intimate Partner Violence: Unknown (11/21/2021)   Received from [facility]   HITS   . Physically Hurt: Not on file   . Insult or Talk Down To: Not on file   . Threaten Physical Harm: Not on file   . Scream or Curse: Not on file    Outpatient Medications Prior to Visit  Medication Sig Dispense Refill  . Continuous Glucose Sensor (FREESTYLE LIBRE 3 SENSOR) MISC Change sensor every 14 days. 2 each 5  . fish oil-omega-3 fatty acids 1000 MG capsule Take 2 g by mouth daily.    Marland Kitchen glucose blood (FREESTYLE LITE) test strip 1 each by Other route as needed for other. 100 each 4  . Krill Oil CAPS Take 1 capsule by mouth daily. 30 capsule 0  . metFORMIN (GLUCOPHAGE-XR) 500 MG 24 hr tablet Take 2 tablets (1,000 mg total) by mouth 2 (two) times daily with a meal. 360 tablet 1  . Multiple Vitamin (MULTIVITAMIN) tablet Take 1 tablet by mouth daily.    . sitaGLIPtin (JANUVIA) 100 MG tablet Take 1 tablet (100 mg total) by mouth daily. 30 tablet 5  . lisinopril (ZESTRIL) 10 MG tablet Take 1 tablet (10 mg total) by  mouth daily. 30 tablet 3  . meclizine (ANTIVERT) 25 MG tablet Take 1 tablet (25 mg total) by mouth 3 (three) times daily as needed for dizziness. 30 tablet 0   No facility-administered medications prior to visit.    Allergies  Allergen Reactions  . Sulfa Antibiotics Hives    Review of Systems  Constitutional:  Positive for malaise/fatigue. Negative for fever.  HENT:  Negative for congestion.   Eyes:  Negative for blurred vision.  Respiratory:  Negative for shortness of breath.   Cardiovascular:  Negative for chest pain, palpitations and leg swelling.  Gastrointestinal:  Negative for abdominal pain, blood in stool and nausea.  Genitourinary:  Negative for dysuria and frequency.  Musculoskeletal:  Negative for falls.  Skin:  Negative for rash.  Neurological:  Negative for dizziness, loss of consciousness and headaches.  Endo/Heme/Allergies:  Negative for environmental allergies.  Psychiatric/Behavioral:  Negative for depression. The patient is nervous/anxious and has insomnia.        Objective:    Physical Exam Constitutional:      General: She is not in acute distress.    Appearance: Normal appearance. She is well-developed. She is not toxic-appearing.  HENT:     Head: Normocephalic and atraumatic.     Right Ear: External ear normal.     Left Ear: External ear normal.     Nose: Nose normal.  Eyes:     General:        Right eye: No discharge.        Left eye: No discharge.     Conjunctiva/sclera: Conjunctivae normal.  Neck:     Thyroid: No thyromegaly.  Cardiovascular:     Rate and Rhythm: Normal rate and regular rhythm.     Heart sounds: Normal heart sounds. No murmur heard. Pulmonary:     Effort: Pulmonary effort is normal. No respiratory distress.     Breath sounds: Normal breath sounds.  Abdominal:     General: Bowel sounds are normal.     Palpations: Abdomen is soft.     Tenderness: There is no abdominal tenderness. There is no guarding.  Musculoskeletal:         General: Normal range of motion.     Cervical back: Neck supple.  Lymphadenopathy:     Cervical: No cervical adenopathy.  Skin:    General: Skin is warm and dry.  Neurological:     Mental Status: She is alert and oriented to person, place, and time.  Psychiatric:        Mood and Affect: Mood normal.        Behavior: Behavior normal.        Thought Content: Thought content normal.        Judgment: Judgment normal.    BP 128/74 (BP Location: Left Arm, Patient Position: Sitting, Cuff Size: Normal)   Pulse 63   Temp 97.7 F (36.5 C) (Oral)   Resp 16   Ht 5\' 7"  (1.702 m)   Wt 166 lb 3.2 oz (75.4 kg)   SpO2 97%   BMI 26.03 kg/m  Wt Readings from Last 3 Encounters:  05/05/23 166 lb 3.2 oz (75.4 kg)  01/24/23 161 lb (73 kg)  01/22/23 161 lb (73 kg)    Diabetic Foot Exam - Simple   No data filed    Lab Results  Component Value Date   WBC 7.1 05/05/2023   HGB 13.7 05/05/2023   HCT 43.2 05/05/2023   PLT 264.0 05/05/2023   GLUCOSE 142 (H) 05/05/2023   CHOL 229 (H) 05/05/2023   TRIG 102.0 05/05/2023   HDL 57.90 05/05/2023   LDLDIRECT 163.9 01/23/2012   LDLCALC 151 (H) 05/05/2023   ALT 16 05/05/2023   AST 16 05/05/2023   NA 139 05/05/2023   K 5.3 (H) 05/05/2023   CL 104 05/05/2023   CREATININE 0.84 05/05/2023   BUN 15 05/05/2023   CO2 27  05/05/2023   TSH 2.01 05/05/2023   HGBA1C 7.7 (H) 05/05/2023   MICROALBUR 1.1 05/05/2023    Lab Results  Component Value Date   TSH 2.01 05/05/2023   Lab Results  Component Value Date   WBC 7.1 05/05/2023   HGB 13.7 05/05/2023   HCT 43.2 05/05/2023   MCV 84.8 05/05/2023   PLT 264.0 05/05/2023   Lab Results  Component Value Date   NA 139 05/05/2023   K 5.3 (H) 05/05/2023   CO2 27 05/05/2023   GLUCOSE 142 (H) 05/05/2023   BUN 15 05/05/2023   CREATININE 0.84 05/05/2023   BILITOT 0.5 05/05/2023   ALKPHOS 70 05/05/2023   AST 16 05/05/2023   ALT 16 05/05/2023   PROT 6.6 05/05/2023   ALBUMIN 4.3 05/05/2023    CALCIUM 9.7 05/05/2023   GFR 76.07 05/05/2023   Lab Results  Component Value Date   CHOL 229 (H) 05/05/2023   Lab Results  Component Value Date   HDL 57.90 05/05/2023   Lab Results  Component Value Date   LDLCALC 151 (H) 05/05/2023   Lab Results  Component Value Date   TRIG 102.0 05/05/2023   Lab Results  Component Value Date   CHOLHDL 4 05/05/2023   Lab Results  Component Value Date   HGBA1C 7.7 (H) 05/05/2023       Assessment & Plan:  Hyperlipidemia, unspecified hyperlipidemia type Assessment & Plan: Encouraged heart healthy diet, increase exercise, avoid trans fats, consider a krill oil cap daily  Orders: -     Lipid panel -     TSH  Type 2 diabetes mellitus without complication, without long-term current use of insulin (HCC) Assessment & Plan: hgba1c acceptable, minimize simple carbs. Increase exercise as tolerated. Continue current meds   Orders: -     Comprehensive metabolic panel -     Hemoglobin A1c -     Microalbumin / creatinine urine ratio -     TSH  Gastroesophageal reflux disease without esophagitis Assessment & Plan: Avoid offending foods, start probiotics. Do not eat large meals in late evening and consider raising head of bed.    Orders: -     CBC with Differential/Platelet  Overweight Assessment & Plan: Encouraged DASH or MIND diet, decrease po intake and increase exercise as tolerated. Needs 7-8 hours of sleep nightly. Avoid trans fats, eat small, frequent meals every 4-5 hours with lean proteins, complex carbs and healthy fats. Minimize simple carbs, high fat foods and processed foods      Assessment and Plan    Hair Loss and Skin Changes Likely related to menopause and stress. Discussed the role of hormones, hydration, and diet in maintaining hair and skin health. -regimen of krill oil and multivitamin and biotin -Consider adding protein and healthy fats to diet. -Consider consultation with dermatology or plastics if hair loss  becomes more concerning.  Menopause Symptoms of hot flashes and poor sleep. Discussed the physiological changes that occur during menopause and the importance of hydration and protein intake. -Continue current lifestyle modifications including minimizing simple carbs -Consider consultation with a specialist if symptoms become more severe.  Diabetes Recent A1c of 8.4, but recent trend shows improvement. Patient has not started Januvia. -Continue current lifestyle modifications and monitor blood glucose levels. -Repeat A1c in three months. -Consider starting Januvia if A1c remains above 7.  Eustachian Tube Dysfunction History of inner ear infection causing dizziness and ear pain. Currently, no symptoms. -Continue current management. -If symptoms recur, consider consultation with an  ear, nose, and throat specialist.  General Health Maintenance -Administer flu shot today. -Repeat complete metabolic panel, urine microalbumin, and other relevant labs today. -Schedule follow-up appointment in January and comprehensive physical exam in May.         Danise Edge, MD

## 2023-05-09 ENCOUNTER — Other Ambulatory Visit (INDEPENDENT_AMBULATORY_CARE_PROVIDER_SITE_OTHER): Payer: Managed Care, Other (non HMO)

## 2023-05-09 DIAGNOSIS — E119 Type 2 diabetes mellitus without complications: Secondary | ICD-10-CM

## 2023-05-09 LAB — COMPREHENSIVE METABOLIC PANEL
ALT: 15 U/L (ref 0–35)
AST: 15 U/L (ref 0–37)
Albumin: 4.4 g/dL (ref 3.5–5.2)
Alkaline Phosphatase: 67 U/L (ref 39–117)
BUN: 17 mg/dL (ref 6–23)
CO2: 27 mEq/L (ref 19–32)
Calcium: 9.6 mg/dL (ref 8.4–10.5)
Chloride: 102 mEq/L (ref 96–112)
Creatinine, Ser: 0.83 mg/dL (ref 0.40–1.20)
GFR: 77.16 mL/min (ref >60.00)
Glucose, Bld: 144 mg/dL — ABNORMAL HIGH (ref 70–99)
Potassium: 4.7 mEq/L (ref 3.5–5.1)
Sodium: 137 mEq/L (ref 135–145)
Total Bilirubin: 0.6 mg/dL (ref 0.2–1.2)
Total Protein: 6.6 g/dL (ref 6.0–8.3)

## 2023-05-13 ENCOUNTER — Ambulatory Visit
Admission: RE | Admit: 2023-05-13 | Discharge: 2023-05-13 | Disposition: A | Discharge status: Home / Self Care | Payer: Managed Care, Other (non HMO) | Source: Ambulatory Visit | Attending: Urgent Care | Admitting: Urgent Care

## 2023-05-13 ENCOUNTER — Ambulatory Visit: Payer: Managed Care, Other (non HMO)

## 2023-05-13 VITALS — BP 129/85 | HR 71 | Temp 97.5°F | Resp 16

## 2023-05-13 DIAGNOSIS — S99922A Unspecified injury of left foot, initial encounter: Secondary | ICD-10-CM

## 2023-05-13 DIAGNOSIS — S9032XA Contusion of left foot, initial encounter: Secondary | ICD-10-CM

## 2023-05-13 NOTE — ED Triage Notes (Signed)
Pt c/o LT foot pain since Sunday evening when she dropped a brick on top of her foot. Pain and swelling noted. Ice and ibuprofen prn.

## 2023-05-13 NOTE — ED Provider Notes (Signed)
[facility]    CSN: [id number] Arrival date & time: 05/13/23  4098      History   Chief Complaint Chief Complaint  Patient presents with   Foot Injury    LT    HPI Jennifer Odom is a 57 y.o. female.   Pleasant 57 year old female presents due to concerns of a left foot injury.  She states it happened last evening.  She was trying to help her son rebreak a door frame.  She accidentally dropped the brick on the dorsal aspect of her left foot.  She reports full range of motion of her foot and toes, normal sensation.  She reports swelling and bruising, although she has been icing it and using ibuprofen, and reports significant improvement since yesterday.  She is able to bear weight and ambulate, although is favoring it slightly.   Foot Injury   Past Medical History:  Diagnosis Date   Chicken pox as a child   CTS (carpal tunnel syndrome) 01/23/2012   Diabetes mellitus 08/19/1992   gestational    Elevated BP    Esophageal reflux 05/18/2014   Fatigue 09/25/2011   GDM (gestational diabetes mellitus)    Hyperglycemia    Hyperlipidemia    Mumps as a child   Overweight(278.02)    Preventative health care 09/25/2011    Patient Active Problem List   Diagnosis Date Noted   Benign paroxysmal positional vertigo of right ear 01/24/2023   Left lumbar radiculopathy 02/01/2022   Post-menopausal bleeding 02/01/2022   Screening for colon cancer 02/01/2022   Pelvic pain 09/27/2018   Stress headaches 09/22/2014   Esophageal reflux 05/18/2014   CTS (carpal tunnel syndrome) 01/23/2012   Colon cancer screening 09/25/2011   Fatigue 09/25/2011   Hyperlipidemia    Overweight    Type 2 diabetes mellitus without complication, without long-term current use of insulin (HCC)     Past Surgical History:  Procedure Laterality Date   APPENDECTOMY     high school   ECTOPIC PREGNANCY SURGERY  2004   left side?    OB History   No obstetric history on file.      Home  Medications    Prior to Admission medications   Medication Sig Start Date End Date Taking? Authorizing Provider  Continuous Glucose Sensor (FREESTYLE LIBRE 3 SENSOR) MISC Change sensor every 14 days. 01/01/23   Sandford Craze, NP  fish oil-omega-3 fatty acids 1000 MG capsule Take 2 g by mouth daily.    [provider]  glucose blood (FREESTYLE LITE) test strip 1 each by Other route as needed for other. 05/17/14   Bradd Canary, MD  [facility] CAPS Take 1 capsule by mouth daily. 10/01/11   Bradd Canary, MD  metFORMIN (GLUCOPHAGE-XR) 500 MG 24 hr tablet Take 2 tablets (1,000 mg total) by mouth 2 (two) times daily with a meal. 01/01/23   Sandford Craze, NP  Multiple Vitamin (MULTIVITAMIN) tablet Take 1 tablet by mouth daily.    [provider]    Family History Family History  Problem Relation Age of Onset   Cancer Mother        colon   Diabetes Father        type 2   Heart disease Father        open heart surgery,triple bypass   Leukemia Father    Cancer Father        CLL   Stroke Father    Endometriosis Sister    Endometrial  cancer Sister    Stroke Maternal Grandmother        X several   Hypertension Maternal Grandmother    Emphysema Maternal Grandfather        smoker   Heart attack Paternal Grandfather    Heart disease Paternal Grandfather        MI, sudden death   Cancer Maternal Uncle     Social History Social History   Tobacco Use   Smoking status: Never   Smokeless tobacco: Never  Substance Use Topics   Alcohol use: Yes    Comment: maybe once a month   Drug use: No     Allergies   Sulfa antibiotics   Review of Systems Review of Systems As per HPI  Physical Exam Triage Vital Signs ED Triage Vitals  Encounter Vitals Group     BP 05/13/23 0848 129/85     Systolic BP Percentile --      Diastolic BP Percentile --      Pulse Rate 05/13/23 0848 71     Resp 05/13/23 0848 16     Temp 05/13/23 0848 (!) 97.5 F (36.4 C)      Temp Source 05/13/23 0848 Oral     SpO2 05/13/23 0848 99 %     Weight --      Height --      Head Circumference --      Peak Flow --      Pain Score 05/13/23 0847 6     Pain Loc --      Pain Education --      Exclude from Growth Chart --    No data found.  Updated Vital Signs BP 129/85 (BP Location: Right Arm)   Pulse 71   Temp (!) 97.5 F (36.4 C) (Oral)   Resp 16   SpO2 99%   Visual Acuity Right Eye Distance:   Left Eye Distance:   Bilateral Distance:    Right Eye Near:   Left Eye Near:    Bilateral Near:     Physical Exam Vitals and nursing note reviewed.  Constitutional:      General: She is not in acute distress.    Appearance: Normal appearance. She is normal weight. She is not ill-appearing, toxic-appearing or diaphoretic.  Cardiovascular:     Rate and Rhythm: Normal rate.     Pulses: Normal pulses.  Pulmonary:     Effort: Pulmonary effort is normal. No respiratory distress.  Musculoskeletal:        General: Swelling, tenderness and signs of injury present. Normal range of motion.     Left foot: Normal range of motion.       Feet:  Feet:     Left foot:     Skin integrity: Skin integrity normal. No blister, skin breakdown, erythema or warmth.  Skin:    General: Skin is warm and dry.     Capillary Refill: Capillary refill takes less than 2 seconds.     Coloration: Skin is not jaundiced or pale.     Findings: Bruising present. No erythema.  Neurological:     General: No focal deficit present.     Mental Status: She is alert and oriented to person, place, and time.     Sensory: No sensory deficit.     Motor: No weakness.     Gait: Gait normal.      UC Treatments / Results  Labs (all labs ordered are listed, but only abnormal results are displayed) Labs  Reviewed - No data to display  EKG   Radiology No results found.  Procedures Procedures (including critical care time)  Medications Ordered in UC Medications - No data to  display  Initial Impression / Assessment and Plan / UC Course  I have reviewed the triage vital signs and the nursing notes.  Pertinent labs & imaging results that were available during my care of the patient were reviewed by me and considered in my medical decision making (see chart for details).     *** Final Clinical Impressions(s) / UC Diagnoses   Final diagnoses:  None   Discharge Instructions   None    ED Prescriptions   None    PDMP not reviewed this encounter.

## 2023-05-14 ENCOUNTER — Telehealth: Payer: Self-pay

## 2023-05-14 NOTE — Telephone Encounter (Signed)
Call returned to pt left on nurse VM. Advised to continue to ice and elevate foot 3-4 times a day for 1 week at least. May take ibuprofen 600-800mg  every 6-8 hrs prn for pain. F/u if no improvement in pain atfer 1 week. Call back if any questions or concerns.

## 2023-05-14 NOTE — Discharge Instructions (Addendum)
Your xray does not show any broken bones or dislocations. You have a contusion of the foot. Please read the attached instructions regarding this condition. Elevate your foot and apply ice three times daily. You may continue taking ibuprofen 600-800mg  every 6-8 hours as needed. Take with food. If your symptoms persist >7 days, return for recheck.

## 2023-06-04 ENCOUNTER — Ambulatory Visit (INDEPENDENT_AMBULATORY_CARE_PROVIDER_SITE_OTHER): Payer: Managed Care, Other (non HMO) | Admitting: Family

## 2023-06-04 VITALS — BP 156/82 | HR 78 | Temp 97.5°F | Resp 18 | Ht 67.0 in | Wt 170.8 lb

## 2023-06-04 DIAGNOSIS — H9201 Otalgia, right ear: Secondary | ICD-10-CM | POA: Insufficient documentation

## 2023-06-04 DIAGNOSIS — Z7984 Long term (current) use of oral hypoglycemic drugs: Secondary | ICD-10-CM | POA: Diagnosis not present

## 2023-06-04 DIAGNOSIS — E119 Type 2 diabetes mellitus without complications: Secondary | ICD-10-CM

## 2023-06-04 MED ORDER — FREESTYLE LIBRE 3 SENSOR MISC: 5 refills | Status: DC

## 2023-06-04 MED ORDER — AMOXICILLIN 500 MG PO CAPS
500.0000 mg | ORAL_CAPSULE | Freq: Three times a day (TID) | ORAL | 0 refills | Status: AC
Start: 2023-06-04 — End: 2023-06-14

## 2023-06-04 NOTE — Progress Notes (Signed)
Subjective:     Patient ID: Jennifer Odom, female    DOB: 1965/12/26, 57 y.o.   MRN: [id number]  Chief Complaint  Patient presents with   Ear Pain    Right onset: gradual     HPI  Discussed the use of AI scribe software for clinical note transcription with the patient, who gave verbal consent to proceed.  History of Present Illness   The patient, with a history of vertigo, presents with recurring right ear pain. The pain is localized and not deep into the ear canal. The patient reports no hearing loss or drainage from the ear. The patient experiences occasional vertigo, which she believes may be related to the ear pain. The ear pain has been ongoing since a previous business trip, during which the patient experienced severe pain during a flight landing and temporary hearing loss. The patient also had a sore throat during this trip and sought treatment at an urgent care. The patient reports that the ear pain never completely clears up and always returns at some point.          Health Maintenance Due  Topic Date Due   FOOT EXAM  Never done   OPHTHALMOLOGY EXAM  Never done   HIV Screening  Never done   Hepatitis C Screening  Never done   Colonoscopy  Never done   MAMMOGRAM  12/05/2013   Zoster Vaccines- Shingrix (1 of 2) Never done   INFLUENZA VACCINE  Never done   COVID-19 Vaccine (1 - 2023-24 season) Never done    Past Medical History:  Diagnosis Date   Chicken pox as a child   CTS (carpal tunnel syndrome) 01/23/2012   Diabetes mellitus 08/19/1992   gestational    Elevated BP    Esophageal reflux 05/18/2014   Fatigue 09/25/2011   GDM (gestational diabetes mellitus)    Hyperglycemia    Hyperlipidemia    Mumps as a child   Overweight(278.02)    Preventative health care 09/25/2011    Past Surgical History:  Procedure Laterality Date   APPENDECTOMY     high school   ECTOPIC PREGNANCY SURGERY  2004   left side?    Family History  Problem Relation Age of  Onset   Cancer Mother        colon   Diabetes Father        type 2   Heart disease Father        open heart surgery,triple bypass   Leukemia Father    Cancer Father        CLL   Stroke Father    Endometriosis Sister    Endometrial cancer Sister    Stroke Maternal Grandmother        X several   Hypertension Maternal Grandmother    Emphysema Maternal Grandfather        smoker   Heart attack Paternal Grandfather    Heart disease Paternal Grandfather        MI, sudden death   Cancer Maternal Uncle     Social History   Socioeconomic History   Marital status: Single    Spouse name: Not on file   Number of children: Not on file   Years of education: Not on file   Highest education level: Master's degree (e.g., MA, MS, MEng, MEd, MSW, MBA)  Occupational History   Not on file  Tobacco Use   Smoking status: Never   Smokeless tobacco: Never  Substance and Sexual Activity  Alcohol use: Yes    Comment: maybe once a month   Drug use: No   Sexual activity: Not on file  Other Topics Concern   Not on file  Social History Narrative   Works in Engineer, manufacturing for the [employer]   1 son, grown lives in [place]   3 dogs   Enjoys going to her Condo in the [place]   Social Determinants of Health   Financial Resource Strain: Low Risk  (06/03/2023)   Overall Financial Resource Strain (CARDIA)    Difficulty of Paying Living Expenses: Not hard at all  Food Insecurity: No Food Insecurity (06/03/2023)   Hunger Vital Sign    Worried About Running Out of Food in the Last Year: Never true    Ran Out of Food in the Last Year: Never true  Transportation Needs: No Transportation Needs (06/03/2023)   PRAPARE - Administrator, Civil Service (Medical): No    Lack of Transportation (Non-Medical): No  Physical Activity: Insufficiently Active (06/03/2023)   Exercise Vital Sign    Days of Exercise per Week: 3 days    Minutes of Exercise per Session: 40 min  Stress: No  Stress Concern Present (06/03/2023)   [employer] of Occupational Health - Occupational Stress Questionnaire    Feeling of Stress : Not at all  Social Connections: Moderately Integrated (06/03/2023)   Social Connection and Isolation Panel [NHANES]    Frequency of Communication with Friends and Family: More than three times a week    Frequency of Social Gatherings with Friends and Family: Three times a week    Attends Religious Services: More than 4 times per year    Active Member of Clubs or Organizations: Yes    Attends Banker Meetings: More than 4 times per year    Marital Status: Divorced  Intimate Partner Violence: Unknown (11/21/2021)   Received from [employer]   HITS    Physically Hurt: Not on file    Insult or Talk Down To: Not on file    Threaten Physical Harm: Not on file    Scream or Curse: Not on file    Outpatient Medications Prior to Visit  Medication Sig Dispense Refill   fish oil-omega-3 fatty acids 1000 MG capsule Take 2 g by mouth daily.     glucose blood (FREESTYLE LITE) test strip 1 each by Other route as needed for other. 100 each 4   Krill Oil CAPS Take 1 capsule by mouth daily. 30 capsule 0   metFORMIN (GLUCOPHAGE-XR) 500 MG 24 hr tablet Take 2 tablets (1,000 mg total) by mouth 2 (two) times daily with a meal. 360 tablet 1   Multiple Vitamin (MULTIVITAMIN) tablet Take 1 tablet by mouth daily.     Continuous Glucose Sensor (FREESTYLE LIBRE 3 SENSOR) MISC Change sensor every 14 days. 2 each 5   No facility-administered medications prior to visit.    Allergies  Allergen Reactions   Sulfa Antibiotics Hives    ROS     Objective:    Physical Exam Constitutional:      General: She is not in acute distress.    Appearance: Normal appearance. She is well-developed.  HENT:     Head: Normocephalic and atraumatic.     Right Ear: Tympanic membrane, ear canal and external ear normal.     Left Ear: Tympanic membrane, ear canal  and external ear normal.     Ears:     Comments:  Mild tenderness to palpation of right mastoid process and right tragus    Mouth/Throat:     Mouth: Mucous membranes are moist.     Pharynx: Oropharynx is clear. No oropharyngeal exudate.  Eyes:     General: No scleral icterus. Neck:     Thyroid: No thyromegaly.  Cardiovascular:     Rate and Rhythm: Normal rate and regular rhythm.     Heart sounds: Normal heart sounds. No murmur heard. Pulmonary:     Effort: Pulmonary effort is normal. No respiratory distress.     Breath sounds: Normal breath sounds. No wheezing.  Musculoskeletal:     Cervical back: Neck supple.  Skin:    General: Skin is warm and dry.  Neurological:     Mental Status: She is alert and oriented to person, place, and time.  Psychiatric:        Mood and Affect: Mood normal.        Behavior: Behavior normal.        Thought Content: Thought content normal.        Judgment: Judgment normal.      BP (!) 156/82   Pulse 78   Temp (!) 97.5 F (36.4 C)   Resp 18   Ht 5\' 7"  (1.702 m)   Wt 170 lb 12.8 oz (77.5 kg)   SpO2 99%   BMI 26.75 kg/m  Wt Readings from Last 3 Encounters:  06/04/23 170 lb 12.8 oz (77.5 kg)  05/05/23 166 lb 3.2 oz (75.4 kg)  01/24/23 161 lb (73 kg)       Assessment & Plan:   Problem List Items Addressed This Visit       Unprioritized   Type 2 diabetes mellitus without complication, without long-term current use of insulin (HCC)     Freestyle Libre Malfunction Device stopped working prematurely, causing inaccurate readings and early replacement. -Refill Freestyle Libre prescription. -Advise patient to contact company for possible replacement or coupon if insurance does not cover early refill.      Relevant Medications   Continuous Glucose Sensor (FREESTYLE LIBRE 3 SENSOR) MISC   Otalgia of right ear - Primary    TM is normal.  Pt has some tenderness of the left mastoid process without erythema or swelling.    Recurrent pain in  the right ear, no deep ear pain or hearing loss. Mild vertigo. Possible mastoiditis considered but not confirmed. -Refer to ENT for further evaluation. -Prescribe Amoxicillin for possible ear infection. -If no improvement in a week or if symptoms worsen (redness, swelling, fever), consider CT scan.      Relevant Orders   Ambulatory referral to ENT    I am having Jennifer Odom start on amoxicillin. I am also having her maintain her multivitamin, fish oil-omega-3 fatty acids, Krill Oil, glucose blood, metFORMIN, and FreeStyle Libre 3 Sensor.  Meds ordered this encounter  Medications   amoxicillin (AMOXIL) 500 MG capsule    Sig: Take 1 capsule (500 mg total) by mouth 3 (three) times daily for 10 days.    Dispense:  30 capsule    Refill:  0    Order Specific Question:   Supervising Provider    Answer:   Danise Edge A [[id number]]   Continuous Glucose Sensor (FREESTYLE LIBRE 3 SENSOR) MISC    Sig: Change sensor every 14 days.    Dispense:  2 each    Refill:  5    Order Specific Question:   Supervising Provider    Answer:  BLYTH, STACEY A [[id number]]

## 2023-06-04 NOTE — Patient Instructions (Signed)
VISIT SUMMARY:  During your visit, we discussed your recurring right ear pain and occasional vertigo, which you believe may be related. We also addressed the issue with your [facility] device, which stopped working prematurely.  YOUR PLAN:  -RIGHT EAR PAIN: You have been experiencing recurring pain in your right ear, which may be due to an ear infection. We are referring you to an Ear, Nose, and Throat (ENT) specialist for further evaluation. In the meantime, we are prescribing Amoxicillin to treat the possible infection. If there's no improvement in a week or if your symptoms worsen, please let me know and  we may consider a CT scan.  -FREESTYLE LIBRE MALFUNCTION: Your Freestyle Libre device, which helps monitor your blood sugar levels, stopped working prematurely. We have refilled your prescription for this device. We also advise you to contact the company for a possible replacement or coupon if your insurance does not cover the early refill.  INSTRUCTIONS:  Please make sure to take the prescribed Amoxicillin as directed. If your ear pain does not improve or worsens, please contact us immediately. Also, please reach out to the [facility] company regarding the malfunctioning device and inquire about a replacement or coupon for an early refill.

## 2023-06-04 NOTE — Assessment & Plan Note (Addendum)
TM is normal.  Pt has some tenderness of the left mastoid process without erythema or swelling.    Recurrent pain in the right ear, no deep ear pain or hearing loss. Mild vertigo. Possible mastoiditis considered but not confirmed. -Refer to ENT for further evaluation. -Prescribe Amoxicillin for possible ear infection. -If no improvement in a week or if symptoms worsen (redness, swelling, fever), consider CT scan.

## 2023-06-04 NOTE — Assessment & Plan Note (Signed)
  Freestyle Libre Malfunction Device stopped working prematurely, causing inaccurate readings and early replacement. -Refill Freestyle Libre prescription. -Advise patient to contact company for possible replacement or coupon if insurance does not cover early refill.

## 2023-06-11 ENCOUNTER — Encounter (INDEPENDENT_AMBULATORY_CARE_PROVIDER_SITE_OTHER): Payer: Self-pay | Admitting: Otolaryngology

## 2023-06-27 ENCOUNTER — Encounter (INDEPENDENT_AMBULATORY_CARE_PROVIDER_SITE_OTHER): Payer: Self-pay | Admitting: Otolaryngology

## 2023-08-01 ENCOUNTER — Other Ambulatory Visit: Payer: Self-pay | Admitting: Family Medicine

## 2023-08-01 DIAGNOSIS — E119 Type 2 diabetes mellitus without complications: Secondary | ICD-10-CM

## 2023-08-06 ENCOUNTER — Encounter: Payer: Self-pay | Admitting: Family Medicine

## 2023-08-06 ENCOUNTER — Telehealth: Payer: Managed Care, Other (non HMO) | Admitting: Physician Assistant

## 2023-08-06 DIAGNOSIS — B9789 Other viral agents as the cause of diseases classified elsewhere: Secondary | ICD-10-CM

## 2023-08-06 DIAGNOSIS — J019 Acute sinusitis, unspecified: Secondary | ICD-10-CM | POA: Diagnosis not present

## 2023-08-06 MED ORDER — IPRATROPIUM BROMIDE 0.03 % NA SOLN: 2.0000 | Freq: Two times a day (BID) | NASAL | 0 refills | Status: DC

## 2023-08-06 NOTE — Progress Notes (Signed)
Message sent to patient requesting further input regarding current symptoms. Awaiting patient response.  

## 2023-08-06 NOTE — Progress Notes (Signed)
E-Visit for Sinus Problems  We are sorry that you are not feeling well.  Here is how we plan to help!  I do recommend COVID testing as a precaution.   Based on what you have shared with me it looks like you have sinusitis.  Sinusitis is inflammation and infection in the sinus cavities of the head.  Based on your presentation I believe you most likely have Acute Viral Sinusitis.This is an infection most likely caused by a virus. There is not specific treatment for viral sinusitis other than to help you with the symptoms until the infection runs its course.  You may use an oral decongestant such as Mucinex D or if you have glaucoma or high blood pressure use plain Mucinex. Saline nasal spray help and can safely be used as often as needed for congestion, I have prescribed: Ipratropium Bromide nasal spray 0.03% 2 sprays in eah nostril 2-3 times a day  Some authorities believe that zinc sprays or the use of Echinacea may shorten the course of your symptoms.  Sinus infections are not as easily transmitted as other respiratory infection, however we still recommend that you avoid close contact with loved ones, especially the very young and elderly.  Remember to wash your hands thoroughly throughout the day as this is the number one way to prevent the spread of infection!  Home Care: Only take medications as instructed by your medical team. Do not take these medications with alcohol. A steam or ultrasonic humidifier can help congestion.  You can place a towel over your head and breathe in the steam from hot water coming from a faucet. Avoid close contacts especially the very young and the elderly. Cover your mouth when you cough or sneeze. Always remember to wash your hands.  Get Help Right Away If: You develop worsening fever or sinus pain. You develop a severe head ache or visual changes. Your symptoms persist after you have completed your treatment plan.  Make sure you Understand these  instructions. Will watch your condition. Will get help right away if you are not doing well or get worse.   Thank you for choosing an e-visit.  Your e-visit answers were reviewed by a board certified advanced clinical practitioner to complete your personal care plan. Depending upon the condition, your plan could have included both over the counter or prescription medications.  Please review your pharmacy choice. Make sure the pharmacy is open so you can pick up prescription now. If there is a problem, you may contact your provider through [employer] and have the prescription routed to another pharmacy.  Your safety is important to [place]. If you have drug allergies check your prescription carefully.   For the next 24 hours you can use MyChart to ask questions about today's visit, request a non-urgent call back, or ask for a work or school excuse. You will get an email in the next two days asking about your experience. I hope that your e-visit has been valuable and will speed your recovery.

## 2023-08-06 NOTE — Progress Notes (Signed)
I have spent 5 minutes in review of e-visit questionnaire, review and updating patient chart, medical decision making and response to patient.   Mia Milan Cody Jacklynn Dehaas, PA-C    

## 2023-08-06 NOTE — Telephone Encounter (Signed)
 Care team updated and letter sent for eye exam notes.

## 2023-08-29 ENCOUNTER — Telehealth: Payer: Managed Care, Other (non HMO) | Admitting: Family Medicine

## 2023-08-29 DIAGNOSIS — B029 Zoster without complications: Secondary | ICD-10-CM

## 2023-08-29 MED ORDER — GABAPENTIN 300 MG PO CAPS: 300.0000 mg | ORAL_CAPSULE | Freq: Two times a day (BID) | ORAL | 0 refills | Status: DC

## 2023-08-29 MED ORDER — VALACYCLOVIR HCL 1 G PO TABS: 1000.0000 mg | ORAL_TABLET | Freq: Three times a day (TID) | ORAL | 0 refills | Status: AC

## 2023-08-29 NOTE — Addendum Note (Signed)
 Addended by: Georgana Curio on: 08/29/2023 08:35 AM   Modules accepted: Orders

## 2023-08-29 NOTE — Progress Notes (Signed)
E-visit for Shingles   We are sorry that you are not feeling well. Here is how we plan to help!  Based on what you shared with me it looks like you have shingles.  Shingles or herpes zoster, is a common infection of the nerves.  It is a painful rash caused by the herpes zoster virus.  This is the same virus that causes chickenpox.  After a person has chickenpox, the virus remains inactive in the nerve cells.  Years later, the virus can become active again and travel to the skin.  It typically will appear on one side of the face or body.  Burning or shooting pain, tingling, or itching are early signs of the infection.  Blisters typically scab over in 7 to 10 days and clear up within 2-4 weeks. Shingles is only contagious to people that have never had the chickenpox, the chickenpox vaccine, or anyone who has a compromised immune system.  You should avoid contact with these type of people until your blisters scab over.  I have prescribed Valacyclovir 1g three times daily for 7 days and also Gabapentin 300mg  twice daily as needed for pain   HOME CARE: Apply ice packs (wrapped in a thin towel), cool compresses, or soak in cool bath to help reduce pain. Use calamine lotion to calm itchy skin. Avoid scratching the rash. Avoid direct sunlight.  GET HELP RIGHT AWAY IF: Symptoms that don't away after treatment. A rash or blisters near your eye. Increased drainage, fever, or rash after treatment. Severe pain that doesn't go away.   MAKE SURE YOU   Understand these instructions. Will watch your condition. Will get help right away if you are not doing well or get worse.  Thank you for choosing an e-visit.  Your e-visit answers were reviewed by a board certified advanced clinical practitioner to complete your personal care plan. Depending upon the condition, your plan could have included both over the counter or prescription medications.  Please review your pharmacy choice. Make sure the pharmacy  is open so you can pick up prescription now. If there is a problem, you may contact your provider through [employer] and have the prescription routed to another pharmacy.  Your safety is important to [place]. If you have drug allergies check your prescription carefully.   For the next 24 hours you can use MyChart to ask questions about today's visit, request a non-urgent call back, or ask for a work or school excuse. You will get an email in the next two days asking about your experience. I hope that your e-visit has been valuable and will speed your recovery.    have provided 5 minutes of non face to face time during this encounter for chart review and documentation.

## 2023-09-04 ENCOUNTER — Ambulatory Visit: Payer: Managed Care, Other (non HMO) | Admitting: Family Medicine

## 2023-09-16 ENCOUNTER — Other Ambulatory Visit: Payer: Self-pay | Admitting: Family Medicine

## 2023-09-16 DIAGNOSIS — E119 Type 2 diabetes mellitus without complications: Secondary | ICD-10-CM

## 2023-09-16 MED ORDER — FREESTYLE LIBRE 3 SENSOR MISC: 5 refills | Status: DC

## 2023-09-16 NOTE — Telephone Encounter (Signed)
Copied from CRM [id number]. Topic: Clinical - Medication Refill >> Sep 16, 2023  8:52 AM Marica Otter wrote: Most Recent Primary Care Visit:  Provider: O'SULLIVAN, MELISSA  Department: [facility]  Visit Type: OFFICE VISIT  Date: 06/04/2023  Medication: Continuous Glucose Sensor (FREESTYLE LIBRE 3 SENSOR  Has the patient contacted their pharmacy? Yes (Agent: If no, request that the patient contact the pharmacy for the refill. If patient does not wish to contact the pharmacy document the reason why and proceed with request.) (Agent: If yes, when and what did the pharmacy advise?)  Is this the correct pharmacy for this prescription? Yes If no, delete pharmacy and type the correct one.  This is the patient's preferred pharmacy:  [id number] [place],  - [address] [place] [address] Phone: [phone] Fax: [phone]   Has the prescription been filled recently? No  Is the patient out of the medication? Yes  Has the patient been seen for an appointment in the last year OR does the patient have an upcoming appointment? Yes  Can we respond through MyChart? Yes  Agent: Please be advised that Rx refills may take up to 3 business days. We ask that you follow-up with your pharmacy.

## 2023-10-09 ENCOUNTER — Ambulatory Visit: Payer: Managed Care, Other (non HMO) | Admitting: Family Medicine

## 2023-10-23 ENCOUNTER — Encounter: Payer: Self-pay | Admitting: Family Medicine

## 2023-10-24 ENCOUNTER — Other Ambulatory Visit: Payer: Self-pay | Admitting: Emergency Medicine

## 2023-10-24 DIAGNOSIS — E119 Type 2 diabetes mellitus without complications: Secondary | ICD-10-CM

## 2023-10-24 DIAGNOSIS — E785 Hyperlipidemia, unspecified: Secondary | ICD-10-CM

## 2023-10-24 DIAGNOSIS — K219 Gastro-esophageal reflux disease without esophagitis: Secondary | ICD-10-CM

## 2023-10-24 NOTE — Telephone Encounter (Signed)
 Called patient and scheduled lab appointment. Orders have been put in

## 2023-10-27 ENCOUNTER — Other Ambulatory Visit (INDEPENDENT_AMBULATORY_CARE_PROVIDER_SITE_OTHER)

## 2023-10-27 ENCOUNTER — Other Ambulatory Visit: Payer: Self-pay | Admitting: Family Medicine

## 2023-10-27 DIAGNOSIS — E119 Type 2 diabetes mellitus without complications: Secondary | ICD-10-CM | POA: Diagnosis not present

## 2023-10-27 DIAGNOSIS — E785 Hyperlipidemia, unspecified: Secondary | ICD-10-CM

## 2023-10-27 DIAGNOSIS — K219 Gastro-esophageal reflux disease without esophagitis: Secondary | ICD-10-CM | POA: Diagnosis not present

## 2023-10-27 LAB — COMPREHENSIVE METABOLIC PANEL
ALT: 12 U/L (ref 0–35)
AST: 12 U/L (ref 0–37)
Albumin: 4.2 g/dL (ref 3.5–5.2)
Alkaline Phosphatase: 60 U/L (ref 39–117)
BUN: 15 mg/dL (ref 6–23)
CO2: 25 meq/L (ref 19–32)
Calcium: 9.3 mg/dL (ref 8.4–10.5)
Chloride: 106 meq/L (ref 96–112)
Creatinine, Ser: 0.79 mg/dL (ref 0.40–1.20)
GFR: 81.61 mL/min (ref >60.00)
Glucose, Bld: 187 mg/dL — ABNORMAL HIGH (ref 70–99)
Potassium: 4.1 meq/L (ref 3.5–5.1)
Sodium: 141 meq/L (ref 135–145)
Total Bilirubin: 0.6 mg/dL (ref 0.2–1.2)
Total Protein: 6.1 g/dL (ref 6.0–8.3)

## 2023-10-27 LAB — CBC WITH DIFFERENTIAL/PLATELET
Basophils Absolute: 0.1 10*3/uL (ref 0.0–0.1)
Basophils Relative: 1 % (ref 0.0–3.0)
Eosinophils Absolute: 0.2 10*3/uL (ref 0.0–0.7)
Eosinophils Relative: 2.3 % (ref 0.0–5.0)
HCT: 39.4 % (ref 36.0–46.0)
Hemoglobin: 12.9 g/dL (ref 12.0–15.0)
Lymphocytes Relative: 22.1 % (ref 12.0–46.0)
Lymphs Abs: 1.6 10*3/uL (ref 0.7–4.0)
MCHC: 32.7 g/dL (ref 30.0–36.0)
MCV: 83.6 fl (ref 78.0–100.0)
Monocytes Absolute: 0.8 10*3/uL (ref 0.1–1.0)
Monocytes Relative: 11.8 % (ref 3.0–12.0)
Neutro Abs: 4.4 10*3/uL (ref 1.4–7.7)
Neutrophils Relative %: 62.8 % (ref 43.0–77.0)
Platelets: 259 10*3/uL (ref 150.0–400.0)
RBC: 4.71 Mil/uL (ref 3.87–5.11)
RDW: 15.5 % (ref 11.5–15.5)
WBC: 7.1 10*3/uL (ref 4.0–10.5)

## 2023-10-27 LAB — LIPID PANEL
Cholesterol: 199 mg/dL (ref 0–200)
HDL: 50.4 mg/dL (ref >39.00)
LDL Cholesterol: 129 mg/dL — ABNORMAL HIGH (ref 0–99)
NonHDL: 148.17
Total CHOL/HDL Ratio: 4
Triglycerides: 94 mg/dL (ref 0.0–149.0)
VLDL: 18.8 mg/dL (ref 0.0–40.0)

## 2023-10-27 LAB — TSH: TSH: 3.12 u[IU]/mL (ref 0.35–5.50)

## 2023-10-27 LAB — HEMOGLOBIN A1C: Hgb A1c MFr Bld: 8.2 % — ABNORMAL HIGH (ref 4.6–6.5)

## 2023-10-27 MED ORDER — OZEMPIC (0.25 OR 0.5 MG/DOSE) 2 MG/3ML ~~LOC~~ SOPN: 0.2500 mg | PEN_INJECTOR | SUBCUTANEOUS | 1 refills | Status: DC

## 2023-10-30 ENCOUNTER — Other Ambulatory Visit: Payer: Self-pay | Admitting: Family Medicine

## 2023-10-30 ENCOUNTER — Other Ambulatory Visit (HOSPITAL_COMMUNITY): Payer: Self-pay

## 2023-10-30 ENCOUNTER — Telehealth: Payer: Self-pay | Admitting: Pharmacy Technician

## 2023-10-30 NOTE — Telephone Encounter (Signed)
 Pharmacy Patient Advocate Encounter  Received notification from [facility] that Prior Authorization for [facility] 0.25MG  is not covered. Please select an alternative drug from choices provided and send in a new prescription for that drug

## 2023-10-30 NOTE — Telephone Encounter (Signed)
 Pharmacy Patient Advocate Encounter   Received notification from CoverMyMeds that prior authorization for [facility] 0.25MG  is required/requested.   Insurance verification completed.   The patient is insured through [employer] .   Per test claim: [id number]

## 2023-10-31 ENCOUNTER — Telehealth: Payer: Self-pay

## 2023-10-31 NOTE — Telephone Encounter (Signed)
 Copied from CRM [id number]. Topic: Clinical - Prescription Issue >> Oct 31, 2023  3:23 PM Elizebeth Brooking wrote: Reason for CRM: Patient stated the she spoken to [facility] and she stated they they said it was covered, stated they never received the prior auth  Number [id number]

## 2023-10-31 NOTE — Addendum Note (Signed)
 Addended by: Thelma Barge D on: 10/31/2023 09:33 AM   Modules accepted: Orders

## 2023-10-31 NOTE — Telephone Encounter (Addendum)
 Did not see where the Trulicity was sent in.  Did you want to start with 0.75mg ?  Just wanted to double check.  Patient would like to speak with her insurance first.  She stated that they should pay for the Ozempic.  I had advised her of the change to Trulicity and she stated she will give [place] call back before we send in.

## 2023-11-03 ENCOUNTER — Encounter: Payer: Self-pay | Admitting: Emergency Medicine

## 2023-11-03 NOTE — Telephone Encounter (Signed)
 Called and spoke with patient this morning. I copied and pasted the response that was sent to [place] by [facility], and sent it to patient's MyChart. Patient is reaching out to her insurance company and will get back in touch with [place].   Received notification from [facility] that Prior Authorization for [facility] 0.25MG  is not covered. Please select an alternative drug from choices provided and send in a new prescription for that drug       This is what was sent to the patient's MyChart

## 2023-11-05 MED ORDER — OZEMPIC (0.25 OR 0.5 MG/DOSE) 2 MG/3ML ~~LOC~~ SOPN
0.2500 mg | PEN_INJECTOR | SUBCUTANEOUS | 1 refills | Status: DC
Start: 2023-11-05 — End: 2023-12-25

## 2023-11-05 NOTE — Telephone Encounter (Signed)
 Pt called insurance and sent mychart message stating that ozempic was covered.  Called insurance and answered questions from the prior auth that started by them and it was approved.  Case number [id number]  approved from 10/06/23-10/07/24

## 2023-12-15 ENCOUNTER — Telehealth: Admitting: Nurse Practitioner

## 2023-12-15 DIAGNOSIS — J069 Acute upper respiratory infection, unspecified: Secondary | ICD-10-CM | POA: Diagnosis not present

## 2023-12-15 NOTE — Progress Notes (Signed)
 Jennifer Odom,  We are starting to see less flu in the community. I do not typically recommend anti-viral medication for someone specifically for the flu unless you have respiratory symptoms, or a known close contact.  This may be the start of several different viral illnesses, one other possibility would be COVID.   I would suggest taking a COVID test if you can, at some pharmacies they now have COVID and FLU testing that you can purchase over the counter.   While awaiting results I would recommend symptom management with alternating ibuprofen and tylenol. If you test positive for COVID or FLU please follow up for further instruction.    E-Visit for Upper Respiratory Infection   We are sorry you are not feeling well.  Here is how we plan to help!  Based on what you have shared with me, it looks like you may have a viral upper respiratory infection.  Upper respiratory infections are caused by a large number of viruses; however, rhinovirus is the most common cause.   Symptoms vary from person to person, with common symptoms including sore throat, cough, fatigue or lack of energy and feeling of general discomfort.  A low-grade fever of up to 100.4 may present, but is often uncommon.  Symptoms vary however, and are closely related to a person's age or underlying illnesses.  The most common symptoms associated with an upper respiratory infection are nasal discharge or congestion, cough, sneezing, headache and pressure in the ears and face.  These symptoms usually persist for about 3 to 10 days, but can last up to 2 weeks.  It is important to know that upper respiratory infections do not cause serious illness or complications in most cases.    Upper respiratory infections can be transmitted from person to person, with the most common method of transmission being a person's hands.  The virus is able to live on the skin and can infect other persons for up to 2 hours after direct contact.  Also, these can be  transmitted when someone coughs or sneezes; thus, it is important to cover the mouth to reduce this risk.  To keep the spread of the illness at bay, good hand hygiene is very important.  This is an infection that is most likely caused by a virus. There are no specific treatments other than to help you with the symptoms until the infection runs its course.  We are sorry you are not feeling well.  Here is how we plan to help!   For nasal congestion, you may use an oral decongestants such as Mucinex D or if you have glaucoma or high blood pressure use plain Mucinex.  Saline nasal spray or nasal drops can help and can safely be used as often as needed for congestion.   If you do not have a history of heart disease, hypertension, diabetes or thyroid  disease, prostate/bladder issues or glaucoma, you may also use Sudafed to treat nasal congestion.  It is highly recommended that you consult with a pharmacist or your primary care physician to ensure this medication is safe for you to take.     If you have a cough, you may use cough suppressants such as Delsym and Robitussin.  If you have glaucoma or high blood pressure, you can also use Coricidin HBP.     If you have a sore or scratchy throat, use a saltwater gargle-  to  teaspoon of salt dissolved in a 4-ounce to 8-ounce glass of warm water.  Gargle  the solution for approximately 15-30 seconds and then spit.  It is important not to swallow the solution.  You can also use throat lozenges/cough drops and Chloraseptic spray to help with throat pain or discomfort.  Warm or cold liquids can also be helpful in relieving throat pain.  For headache, pain or general discomfort, you can use Ibuprofen or Tylenol as directed.   Some authorities believe that zinc sprays or the use of Echinacea may shorten the course of your symptoms.   HOME CARE Only take medications as instructed by your medical team. Be sure to drink plenty of fluids. Water is fine as well as  fruit juices, sodas and electrolyte beverages. You may want to stay away from caffeine or alcohol. If you are nauseated, try taking small sips of liquids. How do you know if you are getting enough fluid? Your urine should be a pale yellow or almost colorless. Get rest. Taking a steamy shower or using a humidifier may help nasal congestion and ease sore throat pain. You can place a towel over your head and breathe in the steam from hot water coming from a faucet. Using a saline nasal spray works much the same way. Cough drops, hard candies and sore throat lozenges may ease your cough. Avoid close contacts especially the very young and the elderly Cover your mouth if you cough or sneeze Always remember to wash your hands.   GET HELP RIGHT AWAY IF: You develop worsening fever. If your symptoms do not improve within 10 days You develop yellow or green discharge from your nose over 3 days. You have coughing fits You develop a severe head ache or visual changes. You develop shortness of breath, difficulty breathing or start having chest pain Your symptoms persist after you have completed your treatment plan  MAKE SURE YOU  Understand these instructions. Will watch your condition. Will get help right away if you are not doing well or get worse.  Thank you for choosing an e-visit.  Your e-visit answers were reviewed by a board certified advanced clinical practitioner to complete your personal care plan. Depending upon the condition, your plan could have included both over the counter or prescription medications.  Please review your pharmacy choice. Make sure the pharmacy is open so you can pick up prescription now. If there is a problem, you may contact your provider through [employer] and have the prescription routed to another pharmacy.  Your safety is important to us . If you have drug allergies check your prescription carefully.   For the next 24 hours you can use MyChart to ask  questions about today's visit, request a non-urgent call back, or ask for a work or school excuse. You will get an email in the next two days asking about your experience. I hope that your e-visit has been valuable and will speed your recovery.  I spent approximately 5 minutes reviewing the patient's history, current symptoms and coordinating their care today.

## 2023-12-16 ENCOUNTER — Other Ambulatory Visit: Payer: Self-pay

## 2023-12-16 ENCOUNTER — Emergency Department (HOSPITAL_BASED_OUTPATIENT_CLINIC_OR_DEPARTMENT_OTHER)
Admission: EM | Admit: 2023-12-16 | Discharge: 2023-12-16 | Disposition: A | Discharge status: Home / Self Care | Attending: Emergency Medicine | Admitting: Emergency Medicine

## 2023-12-16 ENCOUNTER — Encounter (HOSPITAL_BASED_OUTPATIENT_CLINIC_OR_DEPARTMENT_OTHER): Payer: Self-pay | Admitting: Emergency Medicine

## 2023-12-16 DIAGNOSIS — R5381 Other malaise: Secondary | ICD-10-CM | POA: Diagnosis not present

## 2023-12-16 DIAGNOSIS — R5383 Other fatigue: Secondary | ICD-10-CM | POA: Diagnosis not present

## 2023-12-16 DIAGNOSIS — R509 Fever, unspecified: Secondary | ICD-10-CM | POA: Insufficient documentation

## 2023-12-16 DIAGNOSIS — R6883 Chills (without fever): Secondary | ICD-10-CM

## 2023-12-16 DIAGNOSIS — E119 Type 2 diabetes mellitus without complications: Secondary | ICD-10-CM | POA: Diagnosis not present

## 2023-12-16 DIAGNOSIS — R519 Headache, unspecified: Secondary | ICD-10-CM | POA: Insufficient documentation

## 2023-12-16 LAB — COMPREHENSIVE METABOLIC PANEL WITH GFR
ALT: 12 U/L (ref 0–44)
AST: 12 U/L — ABNORMAL LOW (ref 15–41)
Albumin: 4.2 g/dL (ref 3.5–5.0)
Alkaline Phosphatase: 70 U/L (ref 38–126)
Anion gap: 12 (ref 5–15)
BUN: 10 mg/dL (ref 6–20)
CO2: 21 mmol/L — ABNORMAL LOW (ref 22–32)
Calcium: 9.2 mg/dL (ref 8.9–10.3)
Chloride: 100 mmol/L (ref 98–111)
Creatinine, Ser: 0.81 mg/dL (ref 0.44–1.00)
GFR, Estimated: >60 mL/min (ref >60)
Glucose, Bld: 161 mg/dL — ABNORMAL HIGH (ref 70–99)
Potassium: 4.3 mmol/L (ref 3.5–5.1)
Sodium: 133 mmol/L — ABNORMAL LOW (ref 135–145)
Total Bilirubin: 0.6 mg/dL (ref 0.0–1.2)
Total Protein: 6.5 g/dL (ref 6.5–8.1)

## 2023-12-16 LAB — CBC WITH DIFFERENTIAL/PLATELET
Abs Immature Granulocytes: 0.07 10*3/uL (ref 0.00–0.07)
Basophils Absolute: 0.1 10*3/uL (ref 0.0–0.1)
Basophils Relative: 0 %
Eosinophils Absolute: 0 10*3/uL (ref 0.0–0.5)
Eosinophils Relative: 0 %
HCT: 40.5 % (ref 36.0–46.0)
Hemoglobin: 13.5 g/dL (ref 12.0–15.0)
Immature Granulocytes: 0 %
Lymphocytes Relative: 5 %
Lymphs Abs: 1 10*3/uL (ref 0.7–4.0)
MCH: 27.3 pg (ref 26.0–34.0)
MCHC: 33.3 g/dL (ref 30.0–36.0)
MCV: 82 fL (ref 80.0–100.0)
Monocytes Absolute: 2.3 10*3/uL — ABNORMAL HIGH (ref 0.1–1.0)
Monocytes Relative: 12 %
Neutro Abs: 16 10*3/uL — ABNORMAL HIGH (ref 1.7–7.7)
Neutrophils Relative %: 83 %
Platelets: 311 10*3/uL (ref 150–400)
RBC: 4.94 MIL/uL (ref 3.87–5.11)
RDW: 14 % (ref 11.5–15.5)
WBC: 19.4 10*3/uL — ABNORMAL HIGH (ref 4.0–10.5)
nRBC: 0 % (ref 0.0–0.2)

## 2023-12-16 LAB — RESP PANEL BY RT-PCR (RSV, FLU A&B, COVID)  RVPGX2
Influenza A by PCR: NEGATIVE
Influenza B by PCR: NEGATIVE
Resp Syncytial Virus by PCR: NEGATIVE
SARS Coronavirus 2 by RT PCR: NEGATIVE

## 2023-12-16 MED ORDER — PROCHLORPERAZINE EDISYLATE 10 MG/2ML IJ SOLN
10.0000 mg | Freq: Once | INTRAMUSCULAR | Status: AC
  Administered 2023-12-16: 10 mg via INTRAVENOUS
  Filled 2023-12-16: qty 2

## 2023-12-16 MED ORDER — MAGNESIUM SULFATE IN D5W 1-5 GM/100ML-% IV SOLN
1.0000 g | Freq: Once | INTRAVENOUS | Status: AC
  Administered 2023-12-16: 1 g via INTRAVENOUS
  Filled 2023-12-16: qty 100

## 2023-12-16 MED ORDER — OXYCODONE-ACETAMINOPHEN 5-325 MG PO TABS
1.0000 | ORAL_TABLET | Freq: Once | ORAL | Status: AC
  Administered 2023-12-16: 1 via ORAL
  Filled 2023-12-16: qty 1

## 2023-12-16 MED ORDER — DIPHENHYDRAMINE HCL 50 MG/ML IJ SOLN
50.0000 mg | Freq: Once | INTRAMUSCULAR | Status: AC
  Administered 2023-12-16: 50 mg via INTRAVENOUS
  Filled 2023-12-16: qty 1

## 2023-12-16 MED ORDER — SODIUM CHLORIDE 0.9 % IV BOLUS
1000.0000 mL | Freq: Once | INTRAVENOUS | Status: AC
  Administered 2023-12-16: 1000 mL via INTRAVENOUS

## 2023-12-16 MED ORDER — OXYCODONE-ACETAMINOPHEN 5-325 MG PO TABS: 1.0000 | ORAL_TABLET | ORAL | 0 refills | Status: DC | PRN

## 2023-12-16 NOTE — ED Triage Notes (Signed)
 Headache, body aches and fever 101  Started Sunday.  Pain not relieved with tylenol or ibuprofen

## 2023-12-16 NOTE — ED Provider Notes (Signed)
  EMERGENCY DEPARTMENT AT [facility] Provider Note   CSN: [id number] Arrival date & time: 12/16/23  1513     History  Chief Complaint  Patient presents with   Headache    Jennifer Odom is a 58 y.o. female.  The history is provided by the patient, the spouse and medical records. No language interpreter was used.  Headache Pain location:  Generalized Quality:  Dull Radiates to:  Does not radiate Severity currently:  8/10 Severity at highest:  8/10 Onset quality:  Gradual Duration:  3 days Timing:  Constant Progression:  Unchanged Chronicity:  New Similar to prior headaches: no   Context: bright light   Relieved by:  Nothing Worsened by:  Nothing Ineffective treatments:  None tried Associated symptoms: fatigue, myalgias and photophobia   Associated symptoms: no abdominal pain, no back pain, no blurred vision, no congestion, no cough, no diarrhea, no dizziness, no drainage, no eye pain, no facial pain, no fever, no focal weakness, no loss of balance, no nausea, no near-syncope, no neck pain, no neck stiffness, no numbness, no paresthesias, no seizures, no sinus pressure, no sore throat, no syncope, no tingling, no visual change, no vomiting and no weakness        Home Medications Prior to Admission medications   Medication Sig Start Date End Date Taking? Authorizing Provider  Continuous Glucose Sensor (FREESTYLE LIBRE 3 SENSOR) MISC Change sensor every 14 days. 09/16/23   Neda Balk, MD  fish oil-omega-3 fatty acids 1000 MG capsule Take 2 g by mouth daily.    [provider]  gabapentin  (NEURONTIN ) 300 MG capsule Take 1 capsule (300 mg total) by mouth 2 (two) times daily for 10 days. 08/29/23 09/08/23  Blair, Diane W, FNP  glucose blood (FREESTYLE LITE) test strip 1 each by Other route as needed for other. 05/17/14   Neda Balk, MD  ipratropium (ATROVENT ) 0.03 % nasal spray Place 2 sprays into both nostrils every 12 (twelve) hours.  08/06/23   Farris Hong, PA-C  Krill Oil CAPS Take 1 capsule by mouth daily. 10/01/11   Neda Balk, MD  metFORMIN  (GLUCOPHAGE -XR) 500 MG 24 hr tablet TAKE 2 TABLETS BY MOUTH 2 TIMES DAILY WITH A MEAL. THEN 1 TAB AT NOON DUE TO SUGAR STILL ELEVATED 08/01/23   Neda Balk, MD  Multiple Vitamin (MULTIVITAMIN) tablet Take 1 tablet by mouth daily.    [provider]  Semaglutide ,0.25 or 0.5MG /DOS, (OZEMPIC , 0.25 OR 0.5 MG/DOSE,) 2 MG/3ML SOPN Inject 0.25 mg into the skin once a week. 11/05/23   Neda Balk, MD      Allergies    Sulfa antibiotics    Review of Systems   Review of Systems  Constitutional:  Positive for chills and fatigue. Negative for activity change and fever.  HENT:  Negative for congestion, postnasal drip, sinus pressure and sore throat.   Eyes:  Positive for photophobia. Negative for blurred vision and pain.  Respiratory:  Negative for cough, chest tightness, shortness of breath, wheezing and stridor.   Cardiovascular:  Negative for chest pain, syncope and near-syncope.  Gastrointestinal:  Negative for abdominal pain, diarrhea, nausea and vomiting.  Genitourinary:  Negative for dysuria and flank pain.  Musculoskeletal:  Positive for myalgias. Negative for back pain, neck pain and neck stiffness.  Skin:  Negative for rash and wound.  Neurological:  Positive for light-headedness and headaches. Negative for dizziness, focal weakness, seizures, syncope, facial asymmetry, speech difficulty, weakness, numbness, paresthesias and loss  of balance.  Psychiatric/Behavioral:  Negative for agitation.   All other systems reviewed and are negative.   Physical Exam Updated Vital Signs BP 138/81   Pulse (!) 108   Temp 98.6 F (37 C) (Oral)   Resp 20   SpO2 98%  Physical Exam Vitals and nursing note reviewed.  Constitutional:      General: She is not in acute distress.    Appearance: She is well-developed. She is not ill-appearing, toxic-appearing or  diaphoretic.  HENT:     Head: Normocephalic and atraumatic.     Nose: No congestion or rhinorrhea.     Mouth/Throat:     Mouth: Mucous membranes are dry.     Pharynx: Oropharynx is clear.  Eyes:     General: No scleral icterus.    Extraocular Movements: Extraocular movements intact.     Conjunctiva/sclera: Conjunctivae normal.     Pupils: Pupils are equal, round, and reactive to light. Pupils are equal.  Cardiovascular:     Rate and Rhythm: Regular rhythm. Tachycardia present.     Pulses: Normal pulses.     Heart sounds: No murmur heard. Pulmonary:     Effort: Pulmonary effort is normal. No respiratory distress.     Breath sounds: Normal breath sounds. No wheezing, rhonchi or rales.  Chest:     Chest wall: No tenderness.  Abdominal:     Palpations: Abdomen is soft.     Tenderness: There is no abdominal tenderness. There is no right CVA tenderness, left CVA tenderness, guarding or rebound.  Musculoskeletal:        General: No swelling or tenderness.     Cervical back: Neck supple. No tenderness.     Right lower leg: No edema.     Left lower leg: No edema.  Skin:    General: Skin is warm and dry.     Capillary Refill: Capillary refill takes less than 2 seconds.     Findings: No erythema or rash.  Neurological:     Mental Status: She is alert.     Cranial Nerves: No cranial nerve deficit.     Sensory: No sensory deficit.     Motor: No weakness.     Coordination: Coordination normal.  Psychiatric:        Mood and Affect: Mood normal.     ED Results / Procedures / Treatments   Labs (all labs ordered are listed, but only abnormal results are displayed) Labs Reviewed  CBC WITH DIFFERENTIAL/PLATELET - Abnormal; Notable for the following components:      Result Value   WBC 19.4 (*)    Neutro Abs 16.0 (*)    Monocytes Absolute 2.3 (*)    All other components within normal limits  COMPREHENSIVE METABOLIC PANEL WITH GFR - Abnormal; Notable for the following components:    Sodium 133 (*)    CO2 21 (*)    Glucose, Bld 161 (*)    AST 12 (*)    All other components within normal limits  RESP PANEL BY RT-PCR (RSV, FLU A&B, COVID)  [id number]    EKG None  Radiology No results found.  Procedures Procedures    Medications Ordered in ED Medications  oxyCODONE-acetaminophen (PERCOCET/ROXICET) 5-325 MG per tablet 1 tablet (has no administration in time range)  sodium chloride 0.9 % bolus 1,000 mL (0 mLs Intravenous Stopped 12/16/23 1713)  prochlorperazine (COMPAZINE) injection 10 mg (10 mg Intravenous Given 12/16/23 1620)  diphenhydrAMINE (BENADRYL) injection 50 mg (50 mg Intravenous Given 12/16/23 1618)  magnesium sulfate IVPB 1 g 100 mL (0 g Intravenous Stopped 12/16/23 1714)    ED Course/ Medical Decision Making/ A&P                                 Medical Decision Making Amount and/or Complexity of Data Reviewed Labs: ordered.  Risk Prescription drug management.    Jennifer Odom is a 58 y.o. female with a past medical history significant for diabetes, hyperlipidemia, and previous appendectomy who is with fevers, chills, fatigue, myalgias, and headaches.  According to patient, for last 3 days she has had headaches that are all of her head.  She reports she has not 1 who has had headaches much in the past but is having a moderate to severe headache on top of her head.  She denies trauma.  She denies any neck pain or neck stiffness.  She reports she started having malaise and fatigue and aching soreness all over her body.  She denies sick contacts to her knowledge and is denying any congestion or cough.  She denies any nausea, vomiting, constipation, diarrhea, or urinary changes.  She denies any rash and denies any tick exposures.  She denies any injuries.  She denies any visual changes speech difficulties or any focal neurologic complaints.  She denies any numbness, tingling, or weakness.  Again she denies any pain or stiffness in her neck whatsoever.  She  does report photophobia and phonophobia.  On exam, lungs are clear and chest is nontender.  Abdomen nontender.  Back and neck are nontender.  Normal range of motion of the neck.  Symmetric smile.  Clear speech.  Pupils are symmetric and reactive with normal extract movements.  Intact finger-nose-finger testing bilaterally.  No carotid bruit.  Normal range of motion of neck.  No rashes seen.  We had a shared decision made conversation about management.  Patient is interested in getting a headache cocktail which we will give her.  Will also get some basic labs as she does report she has been on Ozempic  recently and may be dehydrated.  Fluid will be included with headache cocktail.  We also check for viral illness such as COVID/flu/RSV given the fever 101 earlier today.  We agreed together to hold on chest x-ray given her lack of URI or cough symptoms.  Will hold on urinalysis given her lack of urinary symptoms.  Given her lack of neck pain or neck stiffness we agreed to hold on lumbar puncture as we have such a low suspicion at this time.  Will hold on CT imaging of the head given her lack of focal neurologic complaints trauma and reassuring exam.  Patient agrees with this.  Anticipate reassessment and discharge if patient is feeling better and workup otherwise reassuring.  8:23 PM Workup began to return.  She is negative for COVID/flu/RSV.  CMP shows slight hyponatremia however she was given some fluids and I suspect it has improved.  Her CBC did not show anemia or abnormality with platelets but did show leukocytosis.  I suspect this is consistent with a viral infection.  She still has no neck pain or neck stiffness.  We went to reassess her and although the headache improved with the headache cocktail, it started to worsen again.  We offered more extensive workup with head imaging and more medications or even more labs however patient would rather get a prescription for some pain medicine and go home.  She  will call her PCP and understood extremely strict return precautions.  If symptoms were to change or worsen acutely, patient knows to return.  This was reasonable given her such reassuring exam.  Patient had no other questions or concerns and was discharged in good condition with understanding return precautions and follow-up instructions.          Final Clinical Impression(s) / ED Diagnoses Final diagnoses:  Chills  Malaise and fatigue  Intractable headache, unspecified chronicity pattern, unspecified headache type    Rx / DC Orders ED Discharge Orders          Ordered    oxyCODONE-acetaminophen (PERCOCET/ROXICET) 5-325 MG tablet  Every 4 hours PRN        12/16/23 2020            Clinical Impression: 1. Chills   2. Malaise and fatigue   3. Intractable headache, unspecified chronicity pattern, unspecified headache type     Disposition: Discharge  Condition: Good  I have discussed the results, Dx and Tx plan with the pt(& family if present). He/she/they expressed understanding and agree(s) with the plan. Discharge instructions discussed at great length. Strict return precautions discussed and pt &/or family have verbalized understanding of the instructions. No further questions at time of discharge.    New Prescriptions   OXYCODONE-ACETAMINOPHEN (PERCOCET/ROXICET) 5-325 MG TABLET    Take 1 tablet by mouth every 4 (four) hours as needed for severe pain (pain score 7-10).    Follow Up: Neda Balk, MD [address] [place] [address] [phone]     [facility] Emergency Department at [facility] [address] [place]  [id number] [phone]       Cobey Raineri, Marine Sia, MD 12/16/23 2025

## 2023-12-16 NOTE — Discharge Instructions (Signed)
 Your history, exam, and evaluation today seem consistent with a likely viral infection causing your malaise, fatigue, myalgias, and headache.  You were negative for COVID/flu/RSV but I suspect you may have some other virus.  We checked some blood work and aside from the elevated white blood cell count, your labs are overall reassuring.  Given your lack of any neck pain or neck stiffness, we have a low suspicion for meningitis and with a shared decision-making conversation agreed to hold on lumbar puncture or imaging today.  Your headache began to improve but then started to return.  We again agreed to hold on more tensive workup now and try going home to let you rest and take some over-the-counter and prescription oral medications.  Please consider using the pain medicine if symptoms persist.  If symptoms change or worsen acutely, consider return to the nearest emergency department for more extensive workup.  Please also follow-up with your primary doctor and rest.

## 2023-12-16 NOTE — ED Notes (Signed)
 ED Provider at bedside.

## 2023-12-18 ENCOUNTER — Other Ambulatory Visit: Payer: Self-pay | Admitting: Family Medicine

## 2023-12-18 ENCOUNTER — Telehealth: Payer: Self-pay | Admitting: Neurology

## 2023-12-18 DIAGNOSIS — D72829 Elevated white blood cell count, unspecified: Secondary | ICD-10-CM

## 2023-12-18 DIAGNOSIS — E871 Hypo-osmolality and hyponatremia: Secondary | ICD-10-CM

## 2023-12-18 MED ORDER — CEFDINIR 300 MG PO CAPS: 300.0000 mg | ORAL_CAPSULE | Freq: Two times a day (BID) | ORAL | 0 refills | Status: AC

## 2023-12-18 NOTE — Telephone Encounter (Signed)
 Sending to Dr. Rodrick Clapper to look at recent labs from hospital.   Does she need visit with any provider to discuss new symptoms?

## 2023-12-18 NOTE — Telephone Encounter (Signed)
 Called patient to advise and she was at the pharmacy now picking up the antibiotic. She also would like to know the reason behind anything and what is the diagnosis. She would like a work note for a week due to missing working.

## 2023-12-18 NOTE — Telephone Encounter (Signed)
 Copied from CRM [id number]. Topic: Clinical - Lab/Test Results >> Dec 18, 2023  8:26 AM Marlan Silva wrote: Reason for CRM: Patient called and stated that she was in the hospital and she would like Dr. Rodrick Clapper to go over her lab results and give her a call. She said she still is having a bad headache and she thinks she may need antibiotics.

## 2023-12-19 ENCOUNTER — Encounter: Payer: Self-pay | Admitting: Family Medicine

## 2023-12-22 ENCOUNTER — Telehealth: Payer: Self-pay | Admitting: Family Medicine

## 2023-12-22 ENCOUNTER — Other Ambulatory Visit: Payer: Self-pay

## 2023-12-22 NOTE — Telephone Encounter (Signed)
 Orders are placed, changing them to future now.

## 2023-12-22 NOTE — Telephone Encounter (Signed)
 Patient was advised and states that the antibiotics have helped out a lot and she starting to feel better. She say thank you so much for all you do and responding about her labs so fast. Just a FYI

## 2023-12-22 NOTE — Telephone Encounter (Signed)
 Good morning I have this pt on the schedule for 5/7 just need some orders.

## 2023-12-25 ENCOUNTER — Encounter: Payer: Self-pay | Admitting: Family Medicine

## 2023-12-25 ENCOUNTER — Other Ambulatory Visit: Payer: Self-pay | Admitting: Family Medicine

## 2023-12-25 ENCOUNTER — Other Ambulatory Visit (INDEPENDENT_AMBULATORY_CARE_PROVIDER_SITE_OTHER)

## 2023-12-25 DIAGNOSIS — E871 Hypo-osmolality and hyponatremia: Secondary | ICD-10-CM

## 2023-12-25 DIAGNOSIS — D72829 Elevated white blood cell count, unspecified: Secondary | ICD-10-CM | POA: Diagnosis not present

## 2023-12-25 LAB — CBC WITH DIFFERENTIAL/PLATELET
Basophils Absolute: 0 10*3/uL (ref 0.0–0.1)
Basophils Relative: 0.6 % (ref 0.0–3.0)
Eosinophils Absolute: 0.1 10*3/uL (ref 0.0–0.7)
Eosinophils Relative: 1.2 % (ref 0.0–5.0)
HCT: 41.5 % (ref 36.0–46.0)
Hemoglobin: 13.6 g/dL (ref 12.0–15.0)
Lymphocytes Relative: 22.4 % (ref 12.0–46.0)
Lymphs Abs: 1.7 10*3/uL (ref 0.7–4.0)
MCHC: 32.7 g/dL (ref 30.0–36.0)
MCV: 83.9 fl (ref 78.0–100.0)
Monocytes Absolute: 0.8 10*3/uL (ref 0.1–1.0)
Monocytes Relative: 10.8 % (ref 3.0–12.0)
Neutro Abs: 5 10*3/uL (ref 1.4–7.7)
Neutrophils Relative %: 65 % (ref 43.0–77.0)
Platelets: 420 10*3/uL — ABNORMAL HIGH (ref 150.0–400.0)
RBC: 4.95 Mil/uL (ref 3.87–5.11)
RDW: 14.6 % (ref 11.5–15.5)
WBC: 7.7 10*3/uL (ref 4.0–10.5)

## 2023-12-25 LAB — COMPREHENSIVE METABOLIC PANEL WITH GFR
ALT: 17 U/L (ref 0–35)
AST: 14 U/L (ref 0–37)
Albumin: 4.6 g/dL (ref 3.5–5.2)
Alkaline Phosphatase: 61 U/L (ref 39–117)
BUN: 12 mg/dL (ref 6–23)
CO2: 27 meq/L (ref 19–32)
Calcium: 9.6 mg/dL (ref 8.4–10.5)
Chloride: 103 meq/L (ref 96–112)
Creatinine, Ser: 0.89 mg/dL (ref 0.40–1.20)
GFR: 70.65 mL/min (ref >60.00)
Glucose, Bld: 133 mg/dL — ABNORMAL HIGH (ref 70–99)
Potassium: 5.1 meq/L (ref 3.5–5.1)
Sodium: 139 meq/L (ref 135–145)
Total Bilirubin: 0.4 mg/dL (ref 0.2–1.2)
Total Protein: 6.9 g/dL (ref 6.0–8.3)

## 2024-01-05 ENCOUNTER — Other Ambulatory Visit: Payer: Self-pay | Admitting: Family Medicine

## 2024-01-05 MED ORDER — OZEMPIC (0.25 OR 0.5 MG/DOSE) 2 MG/3ML ~~LOC~~ SOPN: 0.5000 mg | PEN_INJECTOR | SUBCUTANEOUS | 2 refills | Status: DC

## 2024-01-18 ENCOUNTER — Other Ambulatory Visit: Payer: Self-pay | Admitting: Family Medicine

## 2024-01-18 DIAGNOSIS — E119 Type 2 diabetes mellitus without complications: Secondary | ICD-10-CM

## 2024-01-18 NOTE — Assessment & Plan Note (Signed)
 [id number] acceptable, minimize simple carbs. Increase exercise as tolerated. Continue current meds

## 2024-01-18 NOTE — Assessment & Plan Note (Signed)
Avoid offending foods, start probiotics. Do not eat large meals in late evening and consider raising head of bed.  

## 2024-01-18 NOTE — Assessment & Plan Note (Signed)
 Encouraged heart healthy diet, increase exercise, avoid trans fats, consider a krill oil cap daily

## 2024-01-19 MED ORDER — OZEMPIC (0.25 OR 0.5 MG/DOSE) 2 MG/3ML ~~LOC~~ SOPN
0.5000 mg | PEN_INJECTOR | SUBCUTANEOUS | 1 refills | Status: DC
Start: 2024-01-19 — End: 2024-01-27

## 2024-01-20 ENCOUNTER — Telehealth: Admitting: Family Medicine

## 2024-01-20 DIAGNOSIS — K219 Gastro-esophageal reflux disease without esophagitis: Secondary | ICD-10-CM

## 2024-01-20 DIAGNOSIS — E119 Type 2 diabetes mellitus without complications: Secondary | ICD-10-CM

## 2024-01-20 DIAGNOSIS — E785 Hyperlipidemia, unspecified: Secondary | ICD-10-CM

## 2024-01-21 NOTE — Progress Notes (Signed)
 Patient not seen.

## 2024-01-27 ENCOUNTER — Other Ambulatory Visit: Payer: Self-pay | Admitting: Family Medicine

## 2024-01-27 MED ORDER — OZEMPIC (0.25 OR 0.5 MG/DOSE) 2 MG/3ML ~~LOC~~ SOPN: 0.5000 mg | PEN_INJECTOR | SUBCUTANEOUS | 1 refills | Status: AC

## 2024-01-30 ENCOUNTER — Telehealth: Payer: Self-pay

## 2024-01-30 MED ORDER — FREESTYLE LIBRE 3 PLUS SENSOR MISC: 4 refills | Status: DC

## 2024-01-30 NOTE — Telephone Encounter (Signed)
 Patient requested change to [facility] 3 plus and Dr. Rodrick Clapper approved the change.

## 2024-02-03 ENCOUNTER — Encounter: Payer: Managed Care, Other (non HMO) | Admitting: Family Medicine

## 2024-02-08 NOTE — Assessment & Plan Note (Signed)
 Encouraged heart healthy diet, increase exercise, avoid trans fats, consider a krill oil cap daily

## 2024-02-08 NOTE — Assessment & Plan Note (Signed)
 [id number] acceptable, minimize simple carbs. Increase exercise as tolerated. Continue current meds

## 2024-02-08 NOTE — Assessment & Plan Note (Signed)
 Encouraged DASH or MIND diet, decrease po intake and increase exercise as tolerated. Needs 7-8 hours of sleep nightly. Avoid trans fats, eat small, frequent meals every 4-5 hours with lean proteins, complex carbs and healthy fats. Minimize simple carbs, high fat foods and processed foods

## 2024-02-10 ENCOUNTER — Telehealth: Admitting: Family Medicine

## 2024-02-10 ENCOUNTER — Encounter: Payer: Self-pay | Admitting: Family Medicine

## 2024-02-10 DIAGNOSIS — E663 Overweight: Secondary | ICD-10-CM

## 2024-02-10 DIAGNOSIS — E785 Hyperlipidemia, unspecified: Secondary | ICD-10-CM

## 2024-02-10 DIAGNOSIS — E119 Type 2 diabetes mellitus without complications: Secondary | ICD-10-CM

## 2024-02-11 NOTE — Progress Notes (Signed)
 Patient not seen.

## 2024-06-07 ENCOUNTER — Encounter: Payer: Self-pay | Admitting: Family Medicine

## 2024-06-08 ENCOUNTER — Other Ambulatory Visit: Payer: Self-pay

## 2024-06-08 MED ORDER — FREESTYLE LIBRE 3 PLUS SENSOR MISC: 1 refills | Status: AC

## 2024-07-17 ENCOUNTER — Other Ambulatory Visit: Payer: Self-pay | Admitting: Family Medicine

## 2024-07-17 ENCOUNTER — Encounter: Payer: Self-pay | Admitting: *Deleted

## 2024-07-17 DIAGNOSIS — E119 Type 2 diabetes mellitus without complications: Secondary | ICD-10-CM

## 2024-08-02 ENCOUNTER — Encounter: Payer: Self-pay | Admitting: *Deleted
